# Patient Record
Sex: Female | Born: 1955 | Race: White | Hispanic: No | Marital: Married | Smoking: Former smoker
Health system: Southern US, Community
[De-identification: ages and names within clinical notes are randomized; demographics above are authoritative.]

## PROBLEM LIST (undated history)

## (undated) DIAGNOSIS — H903 Sensorineural hearing loss, bilateral: Secondary | ICD-10-CM

## (undated) DIAGNOSIS — R920 Mammographic microcalcification found on diagnostic imaging of breast: Secondary | ICD-10-CM

## (undated) DIAGNOSIS — E079 Disorder of thyroid, unspecified: Secondary | ICD-10-CM

## (undated) DIAGNOSIS — Z86010 Personal history of colonic polyps: Secondary | ICD-10-CM

## (undated) DIAGNOSIS — D099 Carcinoma in situ, unspecified: Secondary | ICD-10-CM

## (undated) HISTORY — DX: Sensorineural hearing loss, bilateral: H90.3

## (undated) HISTORY — DX: Carcinoma in situ, unspecified: D09.9

## (undated) HISTORY — DX: Disorder of thyroid, unspecified: E07.9

## (undated) HISTORY — DX: Mammographic microcalcification found on diagnostic imaging of breast: R92.0

## (undated) HISTORY — PX: HARDWARE REMOVAL: SHX979

## (undated) HISTORY — DX: Personal history of colonic polyps: Z86.010

---

## 2003-10-26 DIAGNOSIS — R920 Mammographic microcalcification found on diagnostic imaging of breast: Secondary | ICD-10-CM

## 2003-10-26 HISTORY — PX: BREAST BIOPSY: SHX20

## 2003-10-26 HISTORY — PX: BREAST SURGERY: SHX581

## 2003-10-26 HISTORY — DX: Mammographic microcalcification found on diagnostic imaging of breast: R92.0

## 2006-10-25 HISTORY — PX: BREAST CYST ASPIRATION: SHX578

## 2007-10-26 DIAGNOSIS — Z860100 Personal history of colon polyps, unspecified: Secondary | ICD-10-CM

## 2007-10-26 HISTORY — DX: Personal history of colon polyps, unspecified: Z86.0100

## 2007-10-26 HISTORY — DX: Personal history of colonic polyps: Z86.010

## 2009-12-23 HISTORY — PX: FRACTURE SURGERY: SHX138

## 2011-10-26 DIAGNOSIS — D099 Carcinoma in situ, unspecified: Secondary | ICD-10-CM

## 2011-10-26 HISTORY — DX: Carcinoma in situ, unspecified: D09.9

## 2012-07-19 ENCOUNTER — Encounter: Payer: Self-pay | Admitting: Family

## 2012-07-19 ENCOUNTER — Ambulatory Visit (INDEPENDENT_AMBULATORY_CARE_PROVIDER_SITE_OTHER): Payer: TRICARE For Life (TFL) | Admitting: Family

## 2012-07-19 VITALS — BP 102/80 | HR 64 | Temp 98.2°F | Resp 16 | Ht 63.5 in | Wt 152.0 lb

## 2012-07-19 DIAGNOSIS — E039 Hypothyroidism, unspecified: Secondary | ICD-10-CM

## 2012-07-19 NOTE — Progress Notes (Signed)
Subjective:    Patient ID: Alexandra Hoover, female    DOB: 1956-08-04, 56 y.o.   MRN: [id number]  HPI  [name] is a 56 yr old female who presents today to establish care.  She is originally from [place], but has lived all over the country and in [place] in the past 28 years asher husband was in the air [employer].  Most recently, she lived in [place].   Hypothyroid- Pt reports that she was diagnosed with hypothyroidism in 2005. For about a year she was treated with synthroid , but was then increased to .  She reports that she has remained stable at the dose. She denies lethargy or weight gain.      Review of Systems  Constitutional:       Has lost a few pounds.   HENT: Negative for congestion.        Mild hearing problems.  Declines referral to audiology  Eyes: Negative for visual disturbance.  Respiratory: Negative for cough and shortness of breath.   Cardiovascular: Negative for chest pain.       R inner ankle swelling from recent surgery  Gastrointestinal: Negative for nausea, vomiting and diarrhea.  Genitourinary: Negative for dysuria, urgency and frequency.       Lmp age 14  Musculoskeletal:       Occasional knee pain  Skin: Negative for rash.  Neurological: Negative for headaches.  Hematological: Does not bruise/bleed easily.  Psychiatric/Behavioral:       Denies depression/anxiety   Past Medical History  Diagnosis Date  . Thyroid disease     hypothyroid  . History of colon polyps 2009    History   Social History  . Marital Status: Married    Spouse Name: N/A    Number of Children: 5  . Years of Education: N/A   Occupational History  .     Social History Main Topics  . Smoking status: Former Games developer  . Smokeless tobacco: Never Used  . Alcohol Use: 1.0 - 1.5 oz/week    2-3 drink(s) per week  . Drug Use: Not on file  . Sexually Active: Not on file   Other Topics Concern  . Not on file   Social History Narrative   Regular exercise:  4-5 x  weeklyCaffeine use:  2 cups coffee daily4 living children- son in [place], daughter [place], daughter [place], son Alexandra Hoover ([place]1 child died of sidsWorks as a Acupuncturist (telecommutes)    Past Surgical History  Procedure Date  . Breast surgery 2005    Left breast biopsy--microcalcifications  . Fracture surgery 12/2009    fib/tib fracture  . Hardware removal 10/2010 and 05/2017    screws removed from ankle    Family History  Problem Relation Age of Onset  . Atrial fibrillation Mother   . Hypertension Mother   . Hypertension Father   . Heart disease Father   . Hypothyroidism Sister   . Hyperlipidemia Brother   . Diabetes Paternal Aunt   . Stroke Paternal Uncle   . Hypertension Sister     No Known Allergies  Current Outpatient Prescriptions on File Prior to Visit  Medication Sig Dispense Refill  . levothyroxine (SYNTHROID, LEVOTHROID) 50 MCG tablet Take 50 mcg by mouth daily.        BP 102/80  Pulse 64  Temp 98.2 F (36.8 C) (Oral)  Resp 16  Ht 5' 3.5" (1.613 m)  Wt 152 lb (68.947 kg)  BMI 26.50 kg/m2  SpO2 99%  LMP 07/19/2009       Objective:   Physical Exam  Constitutional: She is oriented to person, place, and time. She appears well-developed and well-nourished. No distress.  HENT:  Head: Normocephalic and atraumatic.  Eyes: No scleral icterus.  Cardiovascular: Normal rate and regular rhythm.   No murmur heard. Pulmonary/Chest: Effort normal and breath sounds normal. No respiratory distress. She has no wheezes. She has no rales. She exhibits no tenderness.  Musculoskeletal: She exhibits no edema.  Lymphadenopathy:    She has no cervical adenopathy.  Neurological: She is alert and oriented to person, place, and time.  Skin: Skin is warm and dry.  Psychiatric: She has a normal mood and affect. Her behavior is normal. Judgment and thought content normal.          Assessment & Plan:

## 2012-07-19 NOTE — Patient Instructions (Addendum)
Please schedule fasting physical at your convenience.  Welcome to [facility]! 

## 2012-07-19 NOTE — Assessment & Plan Note (Signed)
Clinically stable. Obtain TSH, continue synthroid.  

## 2012-07-20 ENCOUNTER — Telehealth: Payer: Self-pay | Admitting: Family

## 2012-07-20 LAB — TSH: TSH: 2.159 u[IU]/mL (ref 0.350–4.500)

## 2012-07-20 MED ORDER — LEVOTHYROXINE SODIUM 50 MCG PO TABS: 50.0000 ug | ORAL_TABLET | Freq: Every day | ORAL | Status: DC

## 2012-07-20 NOTE — Telephone Encounter (Signed)
tsh normal, refill sent to pharmacy.

## 2012-07-20 NOTE — Telephone Encounter (Signed)
Notified pt. 

## 2012-09-15 ENCOUNTER — Encounter: Payer: Self-pay | Admitting: Family

## 2012-09-15 ENCOUNTER — Ambulatory Visit (INDEPENDENT_AMBULATORY_CARE_PROVIDER_SITE_OTHER): Payer: TRICARE For Life (TFL) | Admitting: Family

## 2012-09-15 VITALS — BP 112/78 | HR 63 | Temp 98.1°F | Resp 16 | Wt 152.1 lb

## 2012-09-15 DIAGNOSIS — Z0000 Encounter for general adult medical examination without abnormal findings: Secondary | ICD-10-CM

## 2012-09-15 DIAGNOSIS — L989 Disorder of the skin and subcutaneous tissue, unspecified: Secondary | ICD-10-CM

## 2012-09-15 DIAGNOSIS — D099 Carcinoma in situ, unspecified: Secondary | ICD-10-CM | POA: Insufficient documentation

## 2012-09-15 DIAGNOSIS — Z1231 Encounter for screening mammogram for malignant neoplasm of breast: Secondary | ICD-10-CM

## 2012-09-15 NOTE — Patient Instructions (Addendum)
Please schedule mammogram on the first floor.  We will call you with the results of your biopsy.  Keep area dry x 24 hours.  Call if you develop redness surrounding the site.

## 2012-09-15 NOTE — Assessment & Plan Note (Signed)
Procedure including risks/benefits explained to patient.  Questions were answered. After informed consent was obtained site was cleansed with betadine and then alcohol. 1% Lidocaine with epinephrine was injected under lesion and then shave biopsy was performed. Area was cauterized to obtain hemostasis.  Pt tolerated procedure well.  Specimen sent for pathology review.  Pt instructed to keep the area dry for 24 hours and to contact us if he develops redness, drainage or swelling at the site.

## 2012-09-15 NOTE — Addendum Note (Signed)
Addended by: Mervin Kung A on: 09/15/2012 03:04 PM   Modules accepted: Orders

## 2012-09-15 NOTE — Progress Notes (Signed)
  Subjective:    Patient ID: Alexandra Hoover, female    DOB: 1956/07/15, 56 y.o.   MRN: [id number]  HPI  Alexandra Hoover is a 56 yr old female who presents today with chief complaint of skin lesion on her left arm.  Lesion has been present since August and pt reports that it has grown larger.  She would like to have it removed.     Review of Systems    Objective:   Physical Exam  Constitutional: She appears well-developed and well-nourished. No distress.  Skin:       Pink, raised lesion noted on left forearm, rough to the touch.            Assessment & Plan:

## 2012-09-18 ENCOUNTER — Telehealth: Payer: Self-pay | Admitting: Family

## 2012-09-18 DIAGNOSIS — C4490 Unspecified malignant neoplasm of skin, unspecified: Secondary | ICD-10-CM

## 2012-09-18 NOTE — Telephone Encounter (Addendum)
Left message for pt to return my call.

## 2012-09-19 NOTE — Telephone Encounter (Signed)
Spoke to pt. Reviewed pathology findings and suggested referral to dermatology.  Pt is agreeable.

## 2013-01-12 ENCOUNTER — Ambulatory Visit (INDEPENDENT_AMBULATORY_CARE_PROVIDER_SITE_OTHER): Payer: TRICARE For Life (TFL) | Admitting: Family

## 2013-01-12 ENCOUNTER — Encounter: Payer: Self-pay | Admitting: Family

## 2013-01-12 VITALS — BP 100/78 | HR 80 | Temp 98.5°F | Resp 18 | Ht 63.5 in | Wt 154.0 lb

## 2013-01-12 DIAGNOSIS — D099 Carcinoma in situ, unspecified: Secondary | ICD-10-CM

## 2013-01-12 DIAGNOSIS — E039 Hypothyroidism, unspecified: Secondary | ICD-10-CM

## 2013-01-12 NOTE — Assessment & Plan Note (Signed)
Management per derm. Pt instructed to keep follow up visit.

## 2013-01-12 NOTE — Patient Instructions (Addendum)
Please complete lab work prior to leaving.  Follow up in 6 months.  

## 2013-01-12 NOTE — Progress Notes (Signed)
Subjective:    Patient ID: Alexandra Hoover, female    DOB: 05/12/1956, 57 y.o.   MRN: [id number]  HPI  [name] is a 57 yr old female here today for follow up.  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 Noted on skin biopsy November 2013. She was referred to derm and reports that they performed a wider excision.   She has follow up next month.  Hypothyroid- on synthroid.  Weight has been stable.   Review of Systems See hpi  Past Medical History  Diagnosis Date  . Thyroid disease     hypothyroid  . History of colon polyps 2009  . 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 Marital Status: Married    Spouse Name: N/A    Number of Children: 5  . Years of Education: N/A   Occupational History  .     Social History Main Topics  . Smoking status: Former Games developer  . Smokeless tobacco: Never Used  . Alcohol Use: 1 - 1.5 oz/week    2-3 drink(s) per week  . Drug Use: Not on file  . Sexually Active: Not on file   Other Topics Concern  . Not on file   Social History Narrative   Regular exercise:  4-5 x weekly   Caffeine use:  2 cups coffee daily   4 living children- son in [place], daughter [place], daughter [place], son Iline Oven ([place])   1 child died of sids   Works as a Acupuncturist (telecommutes)                Past Surgical History  Procedure Laterality Date  . Breast surgery  2005    Left breast biopsy--microcalcifications  . Fracture surgery  12/2009    fib/tib fracture  . Hardware removal  10/2010 and 05/2017    screws removed from ankle    Family History  Problem Relation Age of Onset  . Atrial fibrillation Mother   . Hypertension Mother   . Hypertension Father   . Heart disease Father   . Hypothyroidism Sister   . Hyperlipidemia Brother   . Diabetes Paternal Aunt   . Stroke Paternal Uncle   . Hypertension Sister     No Known Allergies  Current Outpatient Prescriptions on File Prior  to Visit  Medication Sig Dispense Refill  . levothyroxine (SYNTHROID, LEVOTHROID) 50 MCG tablet Take 1 tablet (50 mcg total) by mouth daily.  30 tablet  5   No current facility-administered medications on file prior to visit.    BP 100/78  Pulse 80  Temp(Src) 98.5 F (36.9 C) (Oral)  Resp 18  Ht 5' 3.5" (1.613 m)  Wt 154 lb (69.854 kg)  BMI 26.85 kg/m2  SpO2 98%  LMP 07/19/2009       Objective:   Physical Exam  Constitutional: She is oriented to person, place, and time. She appears well-developed and well-nourished. No distress.  HENT:  Head: Normocephalic and atraumatic.  Cardiovascular: Normal rate.   No murmur heard. Pulmonary/Chest: Effort normal and breath sounds normal. No respiratory distress. She has no wheezes. She has no rales. She exhibits no tenderness.  Musculoskeletal: She exhibits no edema.  Lymphadenopathy:    She has no cervical adenopathy.  Neurological: She is alert and oriented to person, place, and time.  Skin: Skin is warm and dry.  Psychiatric: She has a normal mood and affect. Her behavior  is normal. Judgment and thought content normal.          Assessment & Plan:

## 2013-01-12 NOTE — Assessment & Plan Note (Signed)
Clinically stable. continue synthroid, obtain tsh.

## 2013-01-15 ENCOUNTER — Telehealth: Payer: Self-pay | Admitting: Family

## 2013-01-15 MED ORDER — LEVOTHYROXINE SODIUM 50 MCG PO TABS: 50.0000 ug | ORAL_TABLET | Freq: Every day | ORAL | Status: DC

## 2013-01-15 NOTE — Telephone Encounter (Signed)
Notified pt. 

## 2013-01-15 NOTE — Telephone Encounter (Signed)
Could you pls let pt know that TSH is normal and  send refill on synthroid #30 with 5 refills to [facility] [address]?  I am having trouble finding the pharmacy in system. Thanks.

## 2013-01-30 ENCOUNTER — Ambulatory Visit
Admission: RE | Admit: 2013-01-30 | Discharge: 2013-01-30 | Disposition: A | Discharge status: Home / Self Care | Payer: TRICARE For Life (TFL) | Source: Ambulatory Visit | Attending: Family | Admitting: Family

## 2013-01-30 ENCOUNTER — Other Ambulatory Visit: Payer: Self-pay | Admitting: Family

## 2013-01-30 ENCOUNTER — Ambulatory Visit: Admission: RE | Admit: 2013-01-30 | Payer: TRICARE For Life (TFL) | Source: Ambulatory Visit

## 2013-01-30 DIAGNOSIS — Z1231 Encounter for screening mammogram for malignant neoplasm of breast: Secondary | ICD-10-CM

## 2013-03-28 ENCOUNTER — Ambulatory Visit (INDEPENDENT_AMBULATORY_CARE_PROVIDER_SITE_OTHER): Payer: TRICARE For Life (TFL) | Admitting: Family

## 2013-03-28 ENCOUNTER — Encounter: Payer: Self-pay | Admitting: Family

## 2013-03-28 VITALS — BP 118/70 | HR 60 | Temp 97.9°F | Ht 63.5 in | Wt 142.1 lb

## 2013-03-28 DIAGNOSIS — N390 Urinary tract infection, site not specified: Secondary | ICD-10-CM

## 2013-03-28 LAB — POCT URINALYSIS DIPSTICK
Glucose, UA: NEGATIVE
Nitrite, UA: NEGATIVE
Protein, UA: NEGATIVE
Spec Grav, UA: <=1.005
Urobilinogen, UA: 0.2
pH, UA: 8

## 2013-03-28 MED ORDER — CIPROFLOXACIN HCL 500 MG PO TABS: 500.0000 mg | ORAL_TABLET | Freq: Two times a day (BID) | ORAL | Status: DC

## 2013-03-28 NOTE — Progress Notes (Signed)
  Subjective:    Patient ID: Alexandra Hoover, female    DOB: Nov 10, 1955, 57 y.o.   MRN: [id number]  HPI  [name] is a 57 yr old female who presents today to discuss dysuria. Symptoms started Saturday AM then resolved.  Today she reports dysuria and "pink lemonade colored lemonade."  In the past she has been on prophylactic macrodantin for recurrent UTI, but weaned herself off and has not had UTI in 3-4 yrs.  Denies fever, low back pain or kidney stones.     Review of Systems See HPI  Past Medical History  Diagnosis Date  . Thyroid disease     hypothyroid  . History of colon polyps 2009  . 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 Marital Status: Married    Spouse Name: N/A    Number of Children: 5  . Years of Education: N/A   Occupational History  .     Social History Main Topics  . Smoking status: Former Games developer  . Smokeless tobacco: Never Used  . Alcohol Use: 1 - 1.5 oz/week    2-3 drink(s) per week  . Drug Use: Not on file  . Sexually Active: Not on file   Other Topics Concern  . Not on file   Social History Narrative   Regular exercise:  4-5 x weekly   Caffeine use:  2 cups coffee daily   4 living children- son in [place], daughter [place], daughter [place], son [place]line [place] ([place]   1 child died of sids   Works as a Acupuncturist (telecommutes)                Past Surgical History  Procedure Laterality Date  . Breast surgery  2005    Left breast biopsy--microcalcifications  . Fracture surgery  12/2009    fib/tib fracture  . Hardware removal  10/2010 and 05/2017    screws removed from ankle    Family History  Problem Relation Age of Onset  . Atrial fibrillation Mother   . Hypertension Mother   . Hypertension Father   . Heart disease Father   . Hypothyroidism Sister   . Hyperlipidemia Brother   . Diabetes Paternal Aunt   . Stroke Paternal Uncle   . Hypertension Sister     No Known  Allergies  Current Outpatient Prescriptions on File Prior to Visit  Medication Sig Dispense Refill  . levothyroxine (SYNTHROID, LEVOTHROID) 50 MCG tablet Take 1 tablet (50 mcg total) by mouth daily.  30 tablet  5   No current facility-administered medications on file prior to visit.    BP 118/70  Pulse 60  Temp(Src) 97.9 F (36.6 C) (Oral)  Ht 5' 3.5" (1.613 m)  Wt 142 lb 1.3 oz (64.447 kg)  BMI 24.77 kg/m2  SpO2 98%  LMP 07/19/2009       Objective:   Physical Exam  Gen: awake, alert, NAD CV: s1/s2, RRR Resp: BS CTA bilaterally without W/R/R Abd: soft, NT/ND, + BP GU: Neg CVAT bilaterally Psych: A and O x 3, calm and pleasant.       Assessment & Plan:

## 2013-03-28 NOTE — Patient Instructions (Addendum)
Please call if symptoms worsen or if not improved in 2-3 days. Please return to the lab in 1 month for follow up Urinalysis.   Urinary Tract Infection Urinary tract infections (UTIs) can develop anywhere along your urinary tract. Your urinary tract is your body's drainage system for removing wastes and extra water. Your urinary tract includes two kidneys, two ureters, a bladder, and a urethra. Your kidneys are a pair of bean-shaped organs. Each kidney is about the size of your fist. They are located below your ribs, one on each side of your spine. CAUSES Infections are caused by microbes, which are microscopic organisms, including fungi, viruses, and bacteria. These organisms are so small that they can only be seen through a microscope. Bacteria are the microbes that most commonly cause UTIs. SYMPTOMS  Symptoms of UTIs may vary by age and gender of the patient and by the location of the infection. Symptoms in young women typically include a frequent and intense urge to urinate and a painful, burning feeling in the bladder or urethra during urination. Older women and men are more likely to be tired, shaky, and weak and have muscle aches and abdominal pain. A fever may mean the infection is in your kidneys. Other symptoms of a kidney infection include pain in your back or sides below the ribs, nausea, and vomiting. DIAGNOSIS To diagnose a UTI, your caregiver will ask you about your symptoms. Your caregiver also will ask to provide a urine sample. The urine sample will be tested for bacteria and white blood cells. White blood cells are made by your body to help fight infection. TREATMENT  Typically, UTIs can be treated with medication. Because most UTIs are caused by a bacterial infection, they usually can be treated with the use of antibiotics. The choice of antibiotic and length of treatment depend on your symptoms and the type of bacteria causing your infection. HOME CARE INSTRUCTIONS  If you were  prescribed antibiotics, take them exactly as your caregiver instructs you. Finish the medication even if you feel better after you have only taken some of the medication.  Drink enough water and fluids to keep your urine clear or pale yellow.  Avoid caffeine, tea, and carbonated beverages. They tend to irritate your bladder.  Empty your bladder often. Avoid holding urine for long periods of time.  Empty your bladder before and after sexual intercourse.  After a bowel movement, women should cleanse from front to back. Use each tissue only once. SEEK MEDICAL CARE IF:   You have back pain.  You develop a fever.  Your symptoms do not begin to resolve within 3 days. SEEK IMMEDIATE MEDICAL CARE IF:   You have severe back pain or lower abdominal pain.  You develop chills.  You have nausea or vomiting.  You have continued burning or discomfort with urination. MAKE SURE YOU:   Understand these instructions.  Will watch your condition.  Will get help right away if you are not doing well or get worse. Document Released: 07/21/2005 Document Revised: 04/11/2012 Document Reviewed: 11/19/2011 [facility] Patient Information [address], [place].

## 2013-03-28 NOTE — Assessment & Plan Note (Signed)
Urine dip notes small leuks, moderate blood. Plan rx with cipro x 7 days given hematuria.  No fever or low back pain to suggest stone/pyelo.  Pt instructed to call if symptoms worsen or if not improved in 2-3 days.

## 2013-03-30 ENCOUNTER — Telehealth: Payer: Self-pay | Admitting: Family

## 2013-03-30 DIAGNOSIS — R3129 Other microscopic hematuria: Secondary | ICD-10-CM

## 2013-03-30 NOTE — Telephone Encounter (Signed)
Pls call pt and let her know that urine culture is neg.  She should stop abx after 3 days.  Follow up in 1 month for repeat urinalysis (dx hematuria). Call sooner if she sees blood in urine.

## 2013-03-30 NOTE — Telephone Encounter (Signed)
Notified pt. She thought she was told there would be a standing order to repeat urine. Does she need to see you in 1 month or urine only?

## 2013-03-31 NOTE — Telephone Encounter (Signed)
Only needs urine no visit in 1 month.

## 2013-04-02 NOTE — Telephone Encounter (Signed)
Notified pt. Future order entered.

## 2013-06-11 ENCOUNTER — Telehealth: Payer: Self-pay | Admitting: Family

## 2013-06-11 NOTE — Telephone Encounter (Signed)
Patient Information:  Caller Name: Princes  Phone: [phone]  Patient: Alexandra Hoover, Alexandra Hoover  Gender: Female  DOB: 02/06/1956  Age: 57 Years  PCP: Sandford Craze (Adults only)  Pregnant: No  Office Follow Up:  Does the office need to follow up with this patient?: No  Instructions For The Office: N/A  RN Note:  Noted facial swelling and mild drooping of right upper lip and cheek.  States is mildly pink but not tender or warm.  Able to move all extremities.  Did not eat anything unusual.  Onset 06/11/13 1400.  Denies weakness of face, inability to smile symmetrically, or weakness of extremities.  Emergent symptoms denied per hives and facial swelling protocols; advised appt within 24 hours per nursing judgment.  Appt scheduled with Ms. O'Sulllivan 06/12/13 0815.  Care measures advised, with callback parameters given.  krs/can  Symptoms  Reason For Call & Symptoms: lip swelling, swelling on cheek  Reviewed Health History In EMR: Yes  Reviewed Medications In EMR: Yes  Reviewed Allergies In EMR: Yes  Reviewed Surgeries / Procedures: Yes  Date of Onset of Symptoms: 06/11/2013 OB / GYN:  LMP: Unknown  Guideline(s) Used:  Face Swelling  Hives  Disposition Per Guideline:   See Within 3 Days in Office  Reason For Disposition Reached:   Mild facial swelling from food reaction and diagnosis never confirmed by a physician  Advice Given:  Reassurance:  Mild face swelling or puffiness can be caused by a mild allergic reaction. For example people can have a reaction to pollen, something they have eaten, a chemical, or some other allergic substance.  Here is some care advice that may help.  Apply Cold to the Area:  Apply a cool, wet washcloth to the face for 20 minutes.  Antihistamine Medication for Facial Swelling:  Sometimes antihistamine medications are helpful in reducing swelling from a food or allergic reaction. You can take diphenhydramine (e.g., Benadryl). The adult dosage 25-50 mg by  mouth every 6 hours on an as needed basis.  Call Back If  Swelling persists over 3 days  Swelling becomes red or painful to the touch  You become worse.  Patient Will Follow Care Advice:  YES  Appointment Scheduled:  06/12/2013 08:15:00 Appointment Scheduled Provider:  Sandford Craze (Adults only)

## 2013-06-11 NOTE — Telephone Encounter (Signed)
Spoke with pt. She confirms that symptoms have not worsened. Took Benadryl around 4pm and thinks there may be slight improvement of facial swelling. Denies sob, speech difficulty or extremity weakness. Advised pt if symptoms worsen to proceed to the ER for evaluation and she voices understanding.

## 2013-06-12 ENCOUNTER — Ambulatory Visit: Payer: Self-pay | Admitting: Family

## 2013-06-12 ENCOUNTER — Telehealth: Payer: Self-pay | Admitting: Family

## 2013-06-12 NOTE — Telephone Encounter (Signed)
Left message for pt to return my call to let me know how she is feeling.

## 2013-07-10 ENCOUNTER — Ambulatory Visit (INDEPENDENT_AMBULATORY_CARE_PROVIDER_SITE_OTHER): Admitting: Family

## 2013-07-10 ENCOUNTER — Encounter: Payer: Self-pay | Admitting: Family

## 2013-07-10 VITALS — BP 110/60 | HR 70 | Temp 98.4°F | Resp 16 | Ht 63.5 in | Wt 136.1 lb

## 2013-07-10 DIAGNOSIS — R3129 Other microscopic hematuria: Secondary | ICD-10-CM

## 2013-07-10 DIAGNOSIS — E039 Hypothyroidism, unspecified: Secondary | ICD-10-CM

## 2013-07-10 NOTE — Progress Notes (Signed)
Subjective:    Patient ID: Alexandra Hoover, female    DOB: [birth year], 57 y.o.   MRN: [id number]  HPI  [name] is a 57 yr old female with hx of hypothyroidism who presents today for follow up.  She is maintained on levothyroxine .  She has lost 18 pounds since march. She has cut back on bread.  Reports feeling well on synthroid dose.    Review of Systems See HPI  Past Medical History  Diagnosis Date  . Thyroid disease     hypothyroid  . History of colon polyps 2009  . 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 Marital Status: Married    Spouse Name: N/A    Number of Children: 5  . Years of Education: N/A   Occupational History  .     Social History Main Topics  . Smoking status: Former Games developer  . Smokeless tobacco: Never Used  . Alcohol Use: 1 - 1.5 oz/week    2-3 drink(s) per week  . Drug Use: Not on file  . Sexual Activity: Not on file   Other Topics Concern  . Not on file   Social History Narrative   Regular exercise:  4-5 x weekly   Caffeine use:  2 cups coffee daily   4 living children- son in [place], daughter [place], daughter [place], son Iline O[place] ([place]   1 child died of sids   Works as a Acupuncturist (telecommutes)                Past Surgical History  Procedure Laterality Date  . Breast surgery  2005    Left breast biopsy--microcalcifications  . Fracture surgery  12/2009    fib/tib fracture  . Hardware removal  10/2010 and 05/2017    screws removed from ankle    Family History  Problem Relation Age of Onset  . Atrial fibrillation Mother   . Hypertension Mother   . Hypertension Father   . Heart disease Father   . Hypothyroidism Sister   . Hyperlipidemia Brother   . Diabetes Paternal Aunt   . Stroke Paternal Uncle   . Hypertension Sister     No Known Allergies  Current Outpatient Prescriptions on File Prior to Visit  Medication Sig Dispense Refill  . levothyroxine  (SYNTHROID, LEVOTHROID) 50 MCG tablet Take 1 tablet (50 mcg total) by mouth daily.  30 tablet  5   No current facility-administered medications on file prior to visit.    BP 110/60  Pulse 70  Temp(Src) 98.4 F (36.9 C) (Oral)  Resp 16  Ht 5' 3.5" (1.613 m)  Wt 136 lb 1.9 oz (61.744 kg)  BMI 23.73 kg/m2  SpO2 97%  LMP 07/19/2009       Objective:   Physical Exam  Constitutional: She is oriented to person, place, and time. She appears well-developed and well-nourished. No distress.  HENT:  Head: Normocephalic and atraumatic.  Cardiovascular: Normal rate and regular rhythm.   No murmur heard. Pulmonary/Chest: Effort normal and breath sounds normal. No respiratory distress. She has no wheezes. She has no rales. She exhibits no tenderness.  Lymphadenopathy:    She has no cervical adenopathy.  Neurological: She is alert and oriented to person, place, and time.  Psychiatric: She has a normal mood and affect. Her behavior is normal. Judgment and thought content normal.          Assessment &  Plan:

## 2013-07-10 NOTE — Patient Instructions (Signed)
Please complete lab work prior to leaving.   

## 2013-07-11 ENCOUNTER — Encounter: Payer: Self-pay | Admitting: Family

## 2013-07-11 LAB — URINALYSIS, ROUTINE W REFLEX MICROSCOPIC
Bilirubin Urine: NEGATIVE
Glucose, UA: NEGATIVE mg/dL
Ketones, ur: NEGATIVE mg/dL
Specific Gravity, Urine: 1.01 (ref 1.005–1.030)
Urobilinogen, UA: 0.2 mg/dL (ref 0.0–1.0)
pH: 6 (ref 5.0–8.0)

## 2013-07-11 MED ORDER — LEVOTHYROXINE SODIUM 50 MCG PO TABS: 50.0000 ug | ORAL_TABLET | Freq: Every day | ORAL | Status: DC

## 2013-07-11 NOTE — Assessment & Plan Note (Signed)
Clinically stable. Obtain tsh, continue synthroid. 

## 2013-07-11 NOTE — Addendum Note (Signed)
Addended by: Sandford Craze on: 07/11/2013 04:03 PM   Modules accepted: Orders

## 2013-12-27 ENCOUNTER — Other Ambulatory Visit: Payer: Self-pay

## 2013-12-27 DIAGNOSIS — Z803 Family history of malignant neoplasm of breast: Secondary | ICD-10-CM

## 2013-12-27 DIAGNOSIS — Z1231 Encounter for screening mammogram for malignant neoplasm of breast: Secondary | ICD-10-CM

## 2014-01-02 ENCOUNTER — Encounter: Payer: Self-pay | Admitting: Family

## 2014-01-02 ENCOUNTER — Ambulatory Visit (INDEPENDENT_AMBULATORY_CARE_PROVIDER_SITE_OTHER): Admitting: Family

## 2014-01-02 VITALS — BP 108/70 | HR 75 | Temp 98.9°F | Resp 16 | Ht 63.5 in | Wt 148.1 lb

## 2014-01-02 DIAGNOSIS — E039 Hypothyroidism, unspecified: Secondary | ICD-10-CM

## 2014-01-02 LAB — TSH: TSH: 2.378 u[IU]/mL (ref 0.350–4.500)

## 2014-01-02 NOTE — Progress Notes (Signed)
Pre visit review using our clinic review tool, if applicable. No additional management support is needed unless otherwise documented below in the visit note. 

## 2014-01-02 NOTE — Progress Notes (Signed)
Subjective:    Patient ID: Alexandra Hoover, female    DOB: 11-Nov-1955, 58 y.o.   MRN: [id number]  HPI  Alexandra Hoover is a 58 yr old female who pesents today for follow up of her hypothyroidism. She was last seen in September. Her current synthroid dose is 50 mcg once daily. Denies heat intolerance.   Has gained some weight back since last visit, but plans to try to get back down now that the weather is improved.  Wt Readings from Last 3 Encounters:  01/02/14 148 lb 1.9 oz (67.187 kg)  07/10/13 136 lb 1.9 oz (61.744 kg)  03/28/13 142 lb 1.3 oz (64.447 kg)   Body mass index is 25.82 kg/(m^2).  Her sister was recently diagnosed with breast cancer. She has upcoming mammogram. Stressed about her sister's treatment.   Review of Systems See HPI  Past Medical History  Diagnosis Date  . Thyroid disease     hypothyroid  . History of colon polyps 2009  . 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 Marital Status: Married    Spouse Name: N/A    Number of Children: 65  . Years of Education: N/A   Occupational History  .     Social History Main Topics  . Smoking status: Former Research scientist (life sciences)  . Smokeless tobacco: Never Used  . Alcohol Use: 1 - 1.5 oz/week    2-3 drink(s) per week  . Drug Use: Not on file  . Sexual Activity: Not on file   Other Topics Concern  . 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 Breast surgery  2005    Left breast biopsy--microcalcifications  . Fracture surgery  12/2009    fib/tib fracture  . Hardware removal  10/2010 and 05/2017    screws removed from ankle    Family History  Problem Relation Age of Onset    . Atrial fibrillation Mother   . Hypertension Mother   . Hypertension Father   . Heart disease Father   . Hypothyroidism Sister   . Hyperlipidemia Brother   . Diabetes Paternal Aunt   . Stroke Paternal Uncle   . Hypertension Sister     No Known Allergies  Current Outpatient Prescriptions on File Prior to Visit  Medication Sig Dispense Refill  . levothyroxine (SYNTHROID, LEVOTHROID) 50 MCG tablet Take 1 tablet (50 mcg total) by mouth daily.  30 tablet  5   No current facility-administered medications on file prior to visit.    LMP 07/19/2009       Objective:   Physical Exam  Constitutional: She is oriented to person, place, and time. She appears well-developed and well-nourished. No distress.  HENT:  Head: Normocephalic and atraumatic.  Neck: No thyromegaly present.  Cardiovascular: Normal rate and regular rhythm.   No murmur heard. Pulmonary/Chest: Effort normal and breath sounds normal. No respiratory distress. She has no wheezes. She has no rales. She exhibits no tenderness.  Neurological: She is alert and oriented to person, place, and time.  Psychiatric: She has a normal mood and affect. Her behavior is normal. Judgment and thought content normal.          Assessment & Plan:

## 2014-01-02 NOTE — Patient Instructions (Signed)
Please complete lab work prior to leaving. Follow up in 6 month for annual physical- after 07/10/14.

## 2014-01-02 NOTE — Assessment & Plan Note (Signed)
Clinically stable, obtain TSH, continue current dose of synthroid.

## 2014-01-03 ENCOUNTER — Encounter: Payer: Self-pay | Admitting: Family

## 2014-01-03 MED ORDER — LEVOTHYROXINE SODIUM 50 MCG PO TABS: 50.0000 ug | ORAL_TABLET | Freq: Every day | ORAL | Status: DC

## 2014-01-31 ENCOUNTER — Ambulatory Visit
Admission: RE | Admit: 2014-01-31 | Discharge: 2014-01-31 | Disposition: A | Discharge status: Home / Self Care | Source: Ambulatory Visit

## 2014-01-31 DIAGNOSIS — Z1231 Encounter for screening mammogram for malignant neoplasm of breast: Secondary | ICD-10-CM

## 2014-01-31 DIAGNOSIS — Z803 Family history of malignant neoplasm of breast: Secondary | ICD-10-CM

## 2014-02-01 ENCOUNTER — Encounter: Payer: Self-pay | Admitting: *Deleted

## 2014-02-01 ENCOUNTER — Other Ambulatory Visit: Payer: Self-pay | Admitting: Family

## 2014-02-01 DIAGNOSIS — R928 Other abnormal and inconclusive findings on diagnostic imaging of breast: Secondary | ICD-10-CM

## 2014-02-01 NOTE — Telephone Encounter (Signed)
Opened in error

## 2014-02-04 ENCOUNTER — Telehealth: Payer: Self-pay | Admitting: *Deleted

## 2014-02-04 ENCOUNTER — Encounter: Payer: Self-pay | Admitting: *Deleted

## 2014-02-04 NOTE — Telephone Encounter (Signed)
Notified pt. She will be having additional imaging next week. Reports that she was told she has microcalcifications in her left breast about 10 years ago. Had stereotactic biopsy and was told it was benign.

## 2014-02-04 NOTE — Telephone Encounter (Signed)
Message copied by Ronny Flurry on Mon Feb 04, 2014  1:43 PM ------      Message from: O'SULLIVAN, MELISSA      Created: Fri Feb 01, 2014  7:09 AM       Please lt pt know that the radiologist noted some calcifications on mammogram which they would like to take a closer look at.  Let me know if she is not contacted about additional imaging within 1 week. ------

## 2014-02-12 ENCOUNTER — Encounter (INDEPENDENT_AMBULATORY_CARE_PROVIDER_SITE_OTHER): Payer: Self-pay

## 2014-02-12 ENCOUNTER — Ambulatory Visit
Admission: RE | Admit: 2014-02-12 | Discharge: 2014-02-12 | Disposition: A | Discharge status: Home / Self Care | Source: Ambulatory Visit | Attending: Family | Admitting: Family

## 2014-02-12 DIAGNOSIS — R928 Other abnormal and inconclusive findings on diagnostic imaging of breast: Secondary | ICD-10-CM

## 2014-07-03 ENCOUNTER — Encounter: Admitting: Family

## 2014-07-12 ENCOUNTER — Other Ambulatory Visit: Payer: Self-pay

## 2014-07-12 ENCOUNTER — Other Ambulatory Visit: Payer: Self-pay | Admitting: Family

## 2014-07-12 DIAGNOSIS — R921 Mammographic calcification found on diagnostic imaging of breast: Secondary | ICD-10-CM

## 2014-07-15 ENCOUNTER — Ambulatory Visit (INDEPENDENT_AMBULATORY_CARE_PROVIDER_SITE_OTHER): Admitting: Family

## 2014-07-15 ENCOUNTER — Encounter: Payer: Self-pay | Admitting: Family

## 2014-07-15 ENCOUNTER — Other Ambulatory Visit (HOSPITAL_COMMUNITY)
Admission: RE | Admit: 2014-07-15 | Discharge: 2014-07-15 | Disposition: A | Discharge status: Home / Self Care | Source: Ambulatory Visit | Attending: Family | Admitting: Family

## 2014-07-15 VITALS — BP 115/78 | HR 67 | Temp 98.7°F | Resp 16 | Ht 63.5 in | Wt 141.5 lb

## 2014-07-15 DIAGNOSIS — Z0000 Encounter for general adult medical examination without abnormal findings: Secondary | ICD-10-CM

## 2014-07-15 DIAGNOSIS — H93239 Hyperacusis, unspecified ear: Secondary | ICD-10-CM

## 2014-07-15 DIAGNOSIS — Z136 Encounter for screening for cardiovascular disorders: Secondary | ICD-10-CM | POA: Diagnosis not present

## 2014-07-15 DIAGNOSIS — Z01419 Encounter for gynecological examination (general) (routine) without abnormal findings: Secondary | ICD-10-CM | POA: Diagnosis present

## 2014-07-15 DIAGNOSIS — H9190 Unspecified hearing loss, unspecified ear: Secondary | ICD-10-CM | POA: Insufficient documentation

## 2014-07-15 DIAGNOSIS — Z23 Encounter for immunization: Secondary | ICD-10-CM

## 2014-07-15 LAB — CBC WITH DIFFERENTIAL/PLATELET
BASOS ABS: 0 10*3/uL (ref 0.0–0.1)
Basophils Relative: 0.4 % (ref 0.0–3.0)
EOS ABS: 0.1 10*3/uL (ref 0.0–0.7)
Eosinophils Relative: 1.4 % (ref 0.0–5.0)
HCT: 42 % (ref 36.0–46.0)
Hemoglobin: 14.1 g/dL (ref 12.0–15.0)
LYMPHS PCT: 28.3 % (ref 12.0–46.0)
Lymphs Abs: 1.5 10*3/uL (ref 0.7–4.0)
MCHC: 33.7 g/dL (ref 30.0–36.0)
MCV: 90 fl (ref 78.0–100.0)
MONO ABS: 0.4 10*3/uL (ref 0.1–1.0)
Monocytes Relative: 7.6 % (ref 3.0–12.0)
NEUTROS PCT: 62.3 % (ref 43.0–77.0)
Neutro Abs: 3.3 10*3/uL (ref 1.4–7.7)
PLATELETS: 204 10*3/uL (ref 150.0–400.0)
RBC: 4.67 Mil/uL (ref 3.87–5.11)
RDW: 12.9 % (ref 11.5–15.5)
WBC: 5.3 10*3/uL (ref 4.0–10.5)

## 2014-07-15 LAB — BASIC METABOLIC PANEL
BUN: 15 mg/dL (ref 6–23)
CHLORIDE: 107 meq/L (ref 96–112)
CO2: 27 meq/L (ref 19–32)
Calcium: 9.4 mg/dL (ref 8.4–10.5)
Creatinine, Ser: 0.9 mg/dL (ref 0.4–1.2)
GFR: 70.64 mL/min (ref >60.00)
Glucose, Bld: 85 mg/dL (ref 70–99)
Potassium: 4.5 mEq/L (ref 3.5–5.1)
Sodium: 144 mEq/L (ref 135–145)

## 2014-07-15 LAB — HEPATIC FUNCTION PANEL
ALBUMIN: 4.3 g/dL (ref 3.5–5.2)
ALK PHOS: 31 U/L — AB (ref 39–117)
ALT: 15 U/L (ref 0–35)
AST: 22 U/L (ref 0–37)
Bilirubin, Direct: 0 mg/dL (ref 0.0–0.3)
TOTAL PROTEIN: 7.4 g/dL (ref 6.0–8.3)
Total Bilirubin: 0.6 mg/dL (ref 0.2–1.2)

## 2014-07-15 LAB — URINALYSIS, ROUTINE W REFLEX MICROSCOPIC
Bilirubin Urine: NEGATIVE
HGB URINE DIPSTICK: NEGATIVE
KETONES UR: NEGATIVE
Leukocytes, UA: NEGATIVE
Nitrite: NEGATIVE
Specific Gravity, Urine: 1.015 (ref 1.000–1.030)
Total Protein, Urine: NEGATIVE
UROBILINOGEN UA: 0.2 (ref 0.0–1.0)
Urine Glucose: NEGATIVE
WBC UA: NONE SEEN — AB (ref 0–?)
pH: 6 (ref 5.0–8.0)

## 2014-07-15 LAB — LIPID PANEL
CHOLESTEROL: 222 mg/dL — AB (ref 0–200)
HDL: 62.1 mg/dL (ref >39.00)
LDL Cholesterol: 135 mg/dL — ABNORMAL HIGH (ref 0–99)
NonHDL: 159.9
TRIGLYCERIDES: 123 mg/dL (ref 0.0–149.0)
Total CHOL/HDL Ratio: 4
VLDL: 24.6 mg/dL (ref 0.0–40.0)

## 2014-07-15 LAB — TSH: TSH: 0.55 u[IU]/mL (ref 0.35–4.50)

## 2014-07-15 NOTE — Progress Notes (Signed)
Subjective:    Patient ID: Alexandra Hoover, female    DOB: [birth year], 58 y.o.   MRN: [id number]  HPI  Patient presents today for complete physical.  Immunizations: due for tetanus, declines Diet: grows a lot of her own food. Exercise: stays active, biking gardening Colonoscopy: 2012 per patient- unsure of results Dexa:2 years ago.  Pap Smear: due Mammogram: up to date Dental: up to date    Review of Systems  Constitutional:       Wt Readings from Last 3 Encounters: 07/15/14 : 141 lb 8 oz (64.184 kg) 01/02/14 : 148 lb 1.9 oz (67.187 kg) 07/10/13 : 136 lb 1.9 oz (61.744 kg)   HENT: Negative for rhinorrhea.        Notes some issues with certain tones.   Eyes: Negative for visual disturbance.  Respiratory: Negative for cough and shortness of breath.   Cardiovascular: Negative for chest pain and leg swelling.  Gastrointestinal: Negative for nausea, vomiting and diarrhea.  Genitourinary: Negative for dysuria and frequency.  Musculoskeletal: Negative for arthralgias and myalgias.       Occasional leg cramps at night, flexing helps  Skin: Negative for rash.  Neurological: Negative for headaches.  Hematological: Negative for adenopathy.  Psychiatric/Behavioral:       Denies depression/anxiety       Past Medical History  Diagnosis Date  . Thyroid disease     hypothyroid  . History of colon polyps 2009  . 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 Microcalcifications of the breast 2005    left breast    History   Social History  . Marital Status: Married    Spouse Name: N/A    Number of Children: 47  . Years of Education: N/A   Occupational History  .     Social History Main Topics  . Smoking status: Former Research scientist (life sciences)  . Smokeless tobacco: Never Used  . Alcohol Use: 1.0 - 1.5 oz/week    2-3 drink(s) per week  . Drug Use: Not on file  . Sexual Activity: Not on file   Other Topics Concern  . 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 Breast surgery  2005    Left breast biopsy--microcalcifications  . Fracture surgery  12/2009    fib/tib fracture  . Hardware removal  10/2010 and 05/2017    screws removed from ankle    Family History  Problem Relation Age of Onset  . Atrial fibrillation Mother   . Hypertension Mother   . Hypertension Father     died 36 following head injury  . Heart disease Father   . Hypothyroidism Sister   . Cancer Sister 6    breast  . Hyperlipidemia Brother   . Diabetes Paternal Aunt   . Stroke Paternal Uncle   . Hypertension Sister     No Known Allergies  Current Outpatient Prescriptions on File Prior to Visit  Medication Sig Dispense Refill  . levothyroxine (SYNTHROID, LEVOTHROID) 50 MCG tablet Take 1 tablet (50 mcg total) by mouth daily.  30 tablet  5   No current facility-administered medications on file prior to visit.    BP 115/78  Pulse 67  Temp(Src) 98.7 F (37.1 C) (Oral)  Resp 16  Ht 5' 3.5" (1.613 m)  Wt 141 lb 8 oz (64.184 kg)  BMI 24.67 kg/m2  SpO2 99%  LMP 07/19/2009    Objective:   Physical Exam  Physical Exam  Constitutional: She is oriented to person, place, and time. She appears well-developed and well-nourished. No distress.  HENT:  Head: Normocephalic and atraumatic.  Right Ear: Tympanic membrane and ear canal normal.  Left Ear: Tympanic membrane and ear canal normal.  Mouth/Throat: Oropharynx is clear and moist.  Eyes: Pupils are equal, round. No scleral icterus.  Neck: Normal range of motion. No thyromegaly present.  Cardiovascular: Normal rate and regular rhythm.   No murmur heard. Pulmonary/Chest: Effort normal and breath sounds normal.  No respiratory distress. He has no wheezes. She has no rales. She exhibits no tenderness.  Abdominal: Soft. Bowel sounds are normal. He exhibits no distension and no mass. There is no tenderness. There is no rebound and no guarding.  Musculoskeletal: She exhibits no edema.  Lymphadenopathy:    She has no cervical adenopathy.  Neurological: She is alert and oriented to person, place, and time. She has normal patellar reflexes. She exhibits normal muscle tone. Coordination normal.  Skin: Skin is warm and dry.  Psychiatric: She has a normal mood and affect. Her behavior is normal. Judgment and thought content normal.  Breasts: Examined lying Right: Without masses, retractions, discharge or axillary adenopathy.  Left: Without masses, retractions, discharge or axillary adenopathy.  Inguinal/mons: Normal without inguinal adenopathy  External genitalia: Normal  BUS/Urethra/Skene's glands: Normal  Bladder: Normal  Vagina: Normal  Cervix: Normal  Uterus: normal in size, shape and contour. Midline and mobile  Adnexa/parametria:  Rt: Without masses or tenderness.  Lt: Without masses or tenderness.  Anus and perineum: Normal           Assessment & Plan:         Assessment & Plan:

## 2014-07-15 NOTE — Assessment & Plan Note (Addendum)
Continue healthy diet/exercise.  Obtain fasting lab work. Pt will obtain copy of her colonoscopy report and forward to me for review.  Tdap today, declines flu shot. Pap performed today.

## 2014-07-15 NOTE — Progress Notes (Signed)
Pre visit review using our clinic review tool, if applicable. No additional management support is needed unless otherwise documented below in the visit note/SLS  

## 2014-07-15 NOTE — Patient Instructions (Signed)
Please complete lab work prior to leaving.  Follow up in 6 months.  

## 2014-07-15 NOTE — Assessment & Plan Note (Signed)
Will refer to audiology for further evaluation

## 2014-07-16 LAB — CYTOLOGY - PAP

## 2014-07-16 NOTE — Telephone Encounter (Signed)
Colonoscopy entered in [facility] Maintenance history; please advise on records/SLS

## 2014-07-16 NOTE — Telephone Encounter (Signed)
See my chart message. Could you please document historical colonoscopy.

## 2014-07-18 ENCOUNTER — Encounter: Payer: Self-pay | Admitting: Family

## 2014-07-18 ENCOUNTER — Other Ambulatory Visit: Payer: Self-pay | Admitting: Family

## 2014-07-19 MED ORDER — LEVOTHYROXINE SODIUM 50 MCG PO TABS: ORAL_TABLET | ORAL | Status: DC

## 2014-08-07 ENCOUNTER — Encounter: Payer: Self-pay | Admitting: Family

## 2014-08-07 DIAGNOSIS — H903 Sensorineural hearing loss, bilateral: Secondary | ICD-10-CM | POA: Insufficient documentation

## 2014-08-07 HISTORY — DX: Sensorineural hearing loss, bilateral: H90.3

## 2014-08-15 ENCOUNTER — Ambulatory Visit
Admission: RE | Admit: 2014-08-15 | Discharge: 2014-08-15 | Disposition: A | Discharge status: Home / Self Care | Source: Ambulatory Visit | Attending: Family | Admitting: Family

## 2014-08-15 DIAGNOSIS — R921 Mammographic calcification found on diagnostic imaging of breast: Secondary | ICD-10-CM

## 2014-12-05 ENCOUNTER — Other Ambulatory Visit: Payer: Self-pay | Admitting: Family

## 2014-12-05 NOTE — Telephone Encounter (Signed)
Last filled:  07/19/14 Amt: 90, 1  Last OV:  07/15/14 Last TSH: 07/15/14  Med filled x 6 months.

## 2015-01-06 ENCOUNTER — Other Ambulatory Visit: Payer: Self-pay | Admitting: Family

## 2015-01-06 DIAGNOSIS — R921 Mammographic calcification found on diagnostic imaging of breast: Secondary | ICD-10-CM

## 2015-01-13 ENCOUNTER — Ambulatory Visit (INDEPENDENT_AMBULATORY_CARE_PROVIDER_SITE_OTHER): Admitting: Family

## 2015-01-13 ENCOUNTER — Encounter: Payer: Self-pay | Admitting: Family

## 2015-01-13 VITALS — BP 102/70 | HR 71 | Temp 97.9°F | Resp 16 | Ht 63.5 in | Wt 151.4 lb

## 2015-01-13 DIAGNOSIS — E785 Hyperlipidemia, unspecified: Secondary | ICD-10-CM | POA: Diagnosis not present

## 2015-01-13 DIAGNOSIS — E039 Hypothyroidism, unspecified: Secondary | ICD-10-CM | POA: Diagnosis not present

## 2015-01-13 LAB — TSH: TSH: 1.32 u[IU]/mL (ref 0.35–4.50)

## 2015-01-13 MED ORDER — ASPIRIN EC 81 MG PO TBEC: 81.0000 mg | DELAYED_RELEASE_TABLET | Freq: Every day | ORAL | Status: AC

## 2015-01-13 NOTE — Progress Notes (Signed)
Subjective:    Patient ID: Alexandra Hoover, female    DOB: [birth year], 59 y.o.   MRN: [id number]  HPI  Alexandra Hoover is a 59 yr old female who presents today for follow up.  Hypothyroid- maintained on synthroid.  Reports feeling well on current dose of synthroid.   Hyperlipidemia-reports that she is not exercising.  Her LDL goal is <160. Lab Results  Component Value Date   CHOL 222* 07/15/2014   HDL 62.10 07/15/2014   LDLCALC 135* 07/15/2014   TRIG 123.0 07/15/2014   CHOLHDL 4 07/15/2014       Review of Systems See HPI  Past Medical History  Diagnosis Date  . Thyroid disease     hypothyroid  . History of colon polyps 2009  . 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 Microcalcifications of the breast 2005    left breast  . 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 Marital Status: Married    Spouse Name: N/A  . Number of Children: 5  . Years of Education: N/A   Occupational History  .     Social History Main Topics  . Smoking status: Former Research scientist (life sciences)  . Smokeless tobacco: Never Used  . Alcohol Use: 1.0 - 1.5 oz/week    2-3 drink(s) per week  . Drug Use: Not on file  . Sexual Activity: Not on file   Other Topics Concern  . Not on file   Social History Narrative   Regular exercise:  4-5 x weekly   Caffeine use:  2 cups coffee daily   4 living children- son in [place], daughter [place], daughter [place], son [place] ([place]   1 child died of sids   Works as a Chartered certified accountant (telecommutes)                Past Surgical History  Procedure Laterality Date  . Breast surgery  2005    Left breast biopsy--microcalcifications  . Fracture surgery  12/2009    fib/tib fracture  . Hardware removal  10/2010 and 05/2017    screws removed from ankle    Family History  Problem Relation Age of Onset  . Atrial fibrillation Mother   . Hypertension Mother   . Hypertension Father     died 92 following  head injury  . Heart disease Father   . Hypothyroidism Sister   . Cancer Sister 69    breast  . Hyperlipidemia Brother   . Diabetes Paternal Aunt   . Stroke Paternal Uncle   . Hypertension Sister     No Known Allergies  Current Outpatient Prescriptions on File Prior to Visit  Medication Sig Dispense Refill  . levothyroxine (SYNTHROID, LEVOTHROID) 50 MCG tablet TAKE 1 TABLET DAILY 90 tablet 1   No current facility-administered medications on file prior to visit.    BP 102/70 mmHg  Pulse 71  Temp(Src) 97.9 F (36.6 C) (Oral)  Resp 16  Ht 5' 3.5" (1.613 m)  Wt 151 lb 6.4 oz (68.675 kg)  BMI 26.40 kg/m2  SpO2 99%  LMP 07/19/2009       Objective:   Physical Exam  Constitutional: She is oriented to person, place, and time. She appears well-developed and well-nourished.  HENT:  Head: Normocephalic and atraumatic.  Cardiovascular: Normal rate, regular rhythm and normal heart sounds.   No murmur heard. Pulmonary/Chest: Effort normal and breath sounds normal. No respiratory distress.  She has no wheezes.  Neurological: She is alert and oriented to person, place, and time.  Psychiatric: She has a normal mood and affect. Her behavior is normal. Judgment and thought content normal.          Assessment & Plan:

## 2015-01-13 NOTE — Progress Notes (Signed)
Pre visit review using our clinic review tool, if applicable. No additional management support is needed unless otherwise documented below in the visit note. 

## 2015-01-13 NOTE — Assessment & Plan Note (Signed)
Reinforced importance of low fat/low cholesterol diet and exercise.

## 2015-01-13 NOTE — Assessment & Plan Note (Signed)
Obtain follow up TSH.  Continue current dose of synthroid.

## 2015-01-13 NOTE — Patient Instructions (Addendum)
Add aspirin 81 mg once daily for heart health. Complete lab work prior to leaving.  Please follow up in early October for your annual physical.

## 2015-01-14 ENCOUNTER — Encounter: Payer: Self-pay | Admitting: Family

## 2015-02-04 ENCOUNTER — Ambulatory Visit
Admission: RE | Admit: 2015-02-04 | Discharge: 2015-02-04 | Disposition: A | Discharge status: Home / Self Care | Source: Ambulatory Visit | Attending: Family | Admitting: Family

## 2015-02-04 DIAGNOSIS — R921 Mammographic calcification found on diagnostic imaging of breast: Secondary | ICD-10-CM

## 2015-05-14 ENCOUNTER — Other Ambulatory Visit: Payer: Self-pay | Admitting: Family

## 2015-11-08 ENCOUNTER — Telehealth: Payer: Self-pay | Admitting: Family

## 2015-11-10 NOTE — Telephone Encounter (Signed)
LVM advising pt that an appt is needed.    Thanks.

## 2015-11-10 NOTE — Telephone Encounter (Signed)
90 day supply of levothyroxine sent to mail order. Pt last seen by PCP 12/2014 and advised CPE for 07/2015. Pt is past due. Please call pt to schedule fasting CPE before further refills are due. If no availability for CPE before next refill then schedule pt for f/u ASAP and CPE can be scheduled at a later date.  Thanks!

## 2015-12-31 ENCOUNTER — Telehealth: Payer: Self-pay | Admitting: *Deleted

## 2015-12-31 ENCOUNTER — Other Ambulatory Visit: Payer: Self-pay | Admitting: Family

## 2015-12-31 DIAGNOSIS — R921 Mammographic calcification found on diagnostic imaging of breast: Secondary | ICD-10-CM

## 2015-12-31 NOTE — Telephone Encounter (Signed)
Received Orders for [facility]; forwarded to provider/SLS 03/08

## 2016-01-23 ENCOUNTER — Encounter: Payer: Self-pay | Admitting: Family

## 2016-02-05 ENCOUNTER — Ambulatory Visit
Admission: RE | Admit: 2016-02-05 | Discharge: 2016-02-05 | Disposition: A | Discharge status: Home / Self Care | Source: Ambulatory Visit | Attending: Family | Admitting: Family

## 2016-02-05 DIAGNOSIS — R921 Mammographic calcification found on diagnostic imaging of breast: Secondary | ICD-10-CM

## 2016-02-06 ENCOUNTER — Other Ambulatory Visit: Payer: Self-pay | Admitting: Family

## 2016-03-01 ENCOUNTER — Encounter: Payer: Self-pay | Admitting: *Deleted

## 2016-03-01 ENCOUNTER — Telehealth: Payer: Self-pay | Admitting: *Deleted

## 2016-03-01 NOTE — Telephone Encounter (Signed)
Pre-Visit Call completed with patient and chart updated.   Pre-Visit Info documented in Specialty Comments under [employer].    

## 2016-03-02 ENCOUNTER — Encounter: Payer: Self-pay | Admitting: Family

## 2016-03-02 ENCOUNTER — Ambulatory Visit (INDEPENDENT_AMBULATORY_CARE_PROVIDER_SITE_OTHER): Admitting: Family

## 2016-03-02 VITALS — BP 107/63 | HR 56 | Temp 97.8°F | Ht 63.5 in | Wt 142.2 lb

## 2016-03-02 DIAGNOSIS — E2839 Other primary ovarian failure: Secondary | ICD-10-CM | POA: Diagnosis not present

## 2016-03-02 DIAGNOSIS — Z0000 Encounter for general adult medical examination without abnormal findings: Secondary | ICD-10-CM | POA: Diagnosis not present

## 2016-03-02 LAB — HEPATIC FUNCTION PANEL
ALT: 10 U/L (ref 0–35)
AST: 16 U/L (ref 0–37)
Albumin: 4.2 g/dL (ref 3.5–5.2)
Alkaline Phosphatase: 31 U/L — ABNORMAL LOW (ref 39–117)
BILIRUBIN DIRECT: 0.1 mg/dL (ref 0.0–0.3)
TOTAL PROTEIN: 6.8 g/dL (ref 6.0–8.3)
Total Bilirubin: 0.6 mg/dL (ref 0.2–1.2)

## 2016-03-02 LAB — LIPID PANEL
CHOLESTEROL: 218 mg/dL — AB (ref 0–200)
HDL: 70.8 mg/dL (ref >39.00)
LDL CALC: 137 mg/dL — AB (ref 0–99)
NonHDL: 147.31
TRIGLYCERIDES: 53 mg/dL (ref 0.0–149.0)
Total CHOL/HDL Ratio: 3
VLDL: 10.6 mg/dL (ref 0.0–40.0)

## 2016-03-02 LAB — URINALYSIS, ROUTINE W REFLEX MICROSCOPIC
BILIRUBIN URINE: NEGATIVE
HGB URINE DIPSTICK: NEGATIVE
Ketones, ur: NEGATIVE
LEUKOCYTES UA: NEGATIVE
NITRITE: NEGATIVE
RBC / HPF: NONE SEEN (ref 0–?)
Specific Gravity, Urine: 1.01 (ref 1.000–1.030)
TOTAL PROTEIN, URINE-UPE24: NEGATIVE
Urine Glucose: NEGATIVE
Urobilinogen, UA: 0.2 (ref 0.0–1.0)
pH: 6.5 (ref 5.0–8.0)

## 2016-03-02 LAB — CBC WITH DIFFERENTIAL/PLATELET
Basophils Absolute: 0 10*3/uL (ref 0.0–0.1)
Basophils Relative: 0.4 % (ref 0.0–3.0)
EOS ABS: 0.1 10*3/uL (ref 0.0–0.7)
Eosinophils Relative: 2.1 % (ref 0.0–5.0)
HCT: 40.3 % (ref 36.0–46.0)
HEMOGLOBIN: 13.9 g/dL (ref 12.0–15.0)
LYMPHS PCT: 26.5 % (ref 12.0–46.0)
Lymphs Abs: 1.9 10*3/uL (ref 0.7–4.0)
MCHC: 34.3 g/dL (ref 30.0–36.0)
MCV: 88.8 fl (ref 78.0–100.0)
MONOS PCT: 7.8 % (ref 3.0–12.0)
Monocytes Absolute: 0.5 10*3/uL (ref 0.1–1.0)
Neutro Abs: 4.4 10*3/uL (ref 1.4–7.7)
Neutrophils Relative %: 63.2 % (ref 43.0–77.0)
Platelets: 199 10*3/uL (ref 150.0–400.0)
RBC: 4.55 Mil/uL (ref 3.87–5.11)
RDW: 13 % (ref 11.5–15.5)
WBC: 7 10*3/uL (ref 4.0–10.5)

## 2016-03-02 LAB — BASIC METABOLIC PANEL
BUN: 13 mg/dL (ref 6–23)
CALCIUM: 9.5 mg/dL (ref 8.4–10.5)
CO2: 28 mEq/L (ref 19–32)
Chloride: 102 mEq/L (ref 96–112)
Creatinine, Ser: 0.77 mg/dL (ref 0.40–1.20)
GFR: 81.93 mL/min (ref >60.00)
GLUCOSE: 91 mg/dL (ref 70–99)
Potassium: 4 mEq/L (ref 3.5–5.1)
Sodium: 139 mEq/L (ref 135–145)

## 2016-03-02 LAB — TSH: TSH: 2.23 u[IU]/mL (ref 0.35–4.50)

## 2016-03-02 NOTE — Progress Notes (Signed)
Subjective:    Patient ID: Alexandra Hoover, female    DOB: Feb 25, 1956, 60 y.o.   MRN: [id number]  HPI   Patient presents today for complete physical.  Immunizations:  Tetanus up to date Diet: reports healthy diet Wt Readings from Last 3 Encounters:  03/02/16 142 lb 3.2 oz (64.501 kg)  01/13/15 151 lb 6.4 oz (68.675 kg)  07/15/14 141 lb 8 oz (64.184 kg)  Exercise: swims/biking, equipment Colonoscopy:  2013 Dexa: 2012 Pap Smear: 2015 Mammogram: 4/17 Vision: last eye exam 12/16 Dental:   Up to date   Review of Systems  Constitutional: Negative for unexpected weight change.  HENT: Positive for rhinorrhea.        Reports hearing loss is unchanged.  Eyes: Negative for visual disturbance.  Respiratory: Negative for cough.   Cardiovascular: Negative for leg swelling.  Gastrointestinal: Negative for diarrhea and constipation.  Genitourinary: Negative for dysuria and frequency.  Musculoskeletal: Negative for myalgias and arthralgias.  Skin: Negative for rash.  Neurological: Negative for headaches.  Hematological: Negative for adenopathy.  Psychiatric/Behavioral:       Denies depression/anxiety   Past Medical History  Diagnosis Date  . Thyroid disease     hypothyroid  . History of colon polyps 2009  . 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 Microcalcifications of the breast 2005    left breast  . 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 Marital Status: Married    Spouse Name: N/A  . Number of Children: 5  . Years of Education: N/A   Occupational History  .     Social History Main Topics  . Smoking status: Former Research scientist (life sciences)  . Smokeless tobacco: Never Used  . Alcohol Use: 1.0 - 1.5 oz/week    2-3 drink(s) per week  . Drug Use: Not on file  . Sexual Activity: Not on file   Other Topics Concern  . Not on file   Social History Narrative   Regular exercise:  4-5 x weekly   Caffeine use:  2 cups  coffee daily   4 living children- son in [place], daughter [place], daughter [place], son Kyla [place] ([place])   1 child died of sids   Works as a Chartered certified accountant (telecommutes)                Past Surgical History  Procedure Laterality Date  . Breast surgery  2005    Left breast biopsy--microcalcifications  . Fracture surgery  12/2009    fib/tib fracture  . Hardware removal  10/2010 and 05/2017    screws removed from ankle    Family History  Problem Relation Age of Onset  . Atrial fibrillation Mother   . Hypertension Mother   . Hypertension Father     died 35 following head injury  . Heart disease Father   . Hypothyroidism Sister   . Cancer Sister 58    breast  . Hyperlipidemia Brother   . Diabetes Paternal Aunt   . Stroke Paternal Uncle   . Hypertension Sister     Allergies  Allergen Reactions  . Chlorine     Pool water    Current Outpatient Prescriptions on File Prior to Visit  Medication Sig Dispense Refill  . aspirin EC 81 MG tablet Take 1 tablet (81 mg total) by mouth daily.    Marland Kitchen levothyroxine (SYNTHROID, LEVOTHROID) 50 MCG tablet TAKE 1 TABLET DAILY  90 tablet 1  . potassium gluconate 595 (99 K) MG TABS tablet Take 595 mg by mouth daily.    Marland Kitchen triamcinolone cream (KENALOG) 0.1 % Reported on 03/01/2016  0   No current facility-administered medications on file prior to visit.    BP 107/63 mmHg  Pulse 56  Temp(Src) 97.8 F (36.6 C) (Oral)  Ht 5' 3.5" (1.613 m)  Wt 142 lb 3.2 oz (64.501 kg)  BMI 24.79 kg/m2  SpO2 99%  LMP 07/19/2009       Objective:   Physical Exam Physical Exam  Constitutional: She is oriented to person, place, and time. She appears well-developed and well-nourished. No distress.  HENT:  Head: Normocephalic and atraumatic.  Right Ear: Tympanic membrane and ear canal normal.  Left Ear: Tympanic membrane and ear canal normal.  Mouth/Throat: Oropharynx is clear and moist.  Eyes: Pupils are equal, round, and reactive to  light. No scleral icterus.  Neck: Normal range of motion. No thyromegaly present.  Cardiovascular: Normal rate and regular rhythm.   No murmur heard. Pulmonary/Chest: Effort normal and breath sounds normal. No respiratory distress. He has no wheezes. She has no rales. She exhibits no tenderness.  Abdominal: Soft. Bowel sounds are normal. He exhibits no distension and no mass. There is no tenderness. There is no rebound and no guarding.  Musculoskeletal: She exhibits no edema.  Lymphadenopathy:    She has no cervical adenopathy.  Neurological: She is alert and oriented to person, place, and time. She has normal patellar reflexes. She exhibits normal muscle tone. Coordination normal.  Skin: Skin is warm and dry.  Psychiatric: She has a normal mood and affect. Her behavior is normal. Judgment and thought content normal.  Breasts: Examined lying Right: Without masses, retractions, discharge or axillary adenopathy.  Left: Without masses, retractions, discharge or axillary adenopathy. (L nipple inverted)          Assessment & Plan:          Assessment & Plan:  EKG tracing is personally reviewed.  EKG notes NSR.  No acute changes.

## 2016-03-02 NOTE — Progress Notes (Signed)
Pre visit review using our clinic review tool, if applicable. No additional management support is needed unless otherwise documented below in the visit note. 

## 2016-03-02 NOTE — Patient Instructions (Signed)
Please complete lab work prior to leaving. You will be contacted about scheduling your bone density. Keep up the great work with healthy diet, exercise.

## 2016-03-02 NOTE — Assessment & Plan Note (Addendum)
Continue healthy diet and exercise. Refer for dexa. Obtain routine lab work.

## 2016-06-04 ENCOUNTER — Encounter: Payer: Self-pay | Admitting: Family

## 2016-06-04 ENCOUNTER — Ambulatory Visit (INDEPENDENT_AMBULATORY_CARE_PROVIDER_SITE_OTHER): Admitting: Family

## 2016-06-04 ENCOUNTER — Telehealth: Payer: Self-pay | Admitting: Family

## 2016-06-04 VITALS — BP 98/70 | HR 60 | Temp 97.7°F | Resp 16 | Ht 64.0 in | Wt 134.6 lb

## 2016-06-04 DIAGNOSIS — M79644 Pain in right finger(s): Secondary | ICD-10-CM | POA: Diagnosis not present

## 2016-06-04 MED ORDER — MELOXICAM 7.5 MG PO TABS: 7.5000 mg | ORAL_TABLET | Freq: Every day | ORAL | 0 refills | Status: DC

## 2016-06-04 NOTE — Telephone Encounter (Signed)
Could you please check status of bone density scheduling? Pt states she has not been contacted yest.

## 2016-06-04 NOTE — Progress Notes (Signed)
Pre visit review using our clinic review tool, if applicable. No additional management support is needed unless otherwise documented below in the visit note. 

## 2016-06-04 NOTE — Progress Notes (Signed)
Subjective:    Patient ID: Alexandra Hoover, female    DOB: August 01, 1956, 60 y.o.   MRN: [id number]  HPI  Alexandra Hoover is a 60 yr old female who presents today with c/o right thumb pain. Worse with pinching a clothes pin or or trying to lift something.  Pain is not worsened by things such as knitting.  Has been present for several months.  Has tried ibuprofen a few times with minimal improvement.   Review of Systems    see HPI  Past Medical History:  Diagnosis Date  . History of colon polyps 2009  . Microcalcifications of the breast 2005   left breast  . 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 Thyroid disease    hypothyroid     Social History   Social History  . Marital status: Married    Spouse name: N/A  . Number of children: 5  . Years of education: N/A   Occupational History  .  [employer]   Social History Main Topics  . Smoking status: Former Research scientist (life sciences)  . Smokeless tobacco: Never Used  . Alcohol use 1.0 - 1.5 oz/week    2 - 3 drink(s) per week  . Drug use: Unknown  . Sexual activity: Not on file   Other Topics Concern  . 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 BREAST SURGERY  2005   Left breast biopsy--microcalcifications  . FRACTURE SURGERY  12/2009   fib/tib fracture  . HARDWARE REMOVAL  10/2010 and 05/2017   screws removed from ankle    Family History  Problem Relation Age of Onset  . Atrial fibrillation Mother   . Hypertension Mother   . Hypertension Father     died 59 following head injury  . Heart disease Father   . Hypothyroidism Sister   . Cancer  Sister 52    breast  . Hyperlipidemia Brother   . Diabetes Paternal Aunt   . Stroke Paternal Uncle   . Hypertension Sister     Allergies  Allergen Reactions  . Chlorine     Pool water    Current Outpatient Prescriptions on File Prior to Visit  Medication Sig Dispense Refill  . aspirin EC 81 MG tablet Take 1 tablet (81 mg total) by mouth daily.    Marland Kitchen levothyroxine (SYNTHROID, LEVOTHROID) 50 MCG tablet TAKE 1 TABLET DAILY 90 tablet 1  . potassium gluconate 595 (99 K) MG TABS tablet Take 595 mg by mouth daily.     No current facility-administered medications on file prior to visit.     BP 98/70   Pulse 60   Temp 97.7 F (36.5 C) (Oral)   Resp 16   Ht 5\' 4"  (1.626 m)   Wt 134 lb 9.6 oz (61.1 kg)   LMP 07/19/2009   SpO2 98% Comment: room air  BMI 23.10 kg/m    Objective:   Physical Exam  Constitutional: She is oriented to person, place, and time. She appears well-developed  and well-nourished.  Musculoskeletal:  No swelling or tenderness noted at base of right thumb.   Neurological: She is alert and oriented to person, place, and time.  Psychiatric: She has a normal mood and affect. Her behavior is normal. Judgment and thought content normal.          Assessment & Plan:  R thumb pain-New.  trial of meloxicam. If not improvement plan referral to sports medicine.

## 2016-06-04 NOTE — Telephone Encounter (Signed)
Called pt and scheduling at [facility]

## 2016-06-04 NOTE — Patient Instructions (Signed)
Please begin meloxicam once daily for your thumb pain. Call or send me a mychart message if pain worsens or does not improve in 1 week and we will plan to refer you to sports medicine.

## 2016-06-08 ENCOUNTER — Ambulatory Visit (HOSPITAL_BASED_OUTPATIENT_CLINIC_OR_DEPARTMENT_OTHER)
Admission: RE | Admit: 2016-06-08 | Discharge: 2016-06-08 | Disposition: A | Discharge status: Home / Self Care | Source: Ambulatory Visit | Attending: Family | Admitting: Family

## 2016-06-08 DIAGNOSIS — M810 Age-related osteoporosis without current pathological fracture: Secondary | ICD-10-CM | POA: Diagnosis not present

## 2016-06-08 DIAGNOSIS — E2839 Other primary ovarian failure: Secondary | ICD-10-CM | POA: Diagnosis present

## 2016-06-10 ENCOUNTER — Telehealth: Payer: Self-pay | Admitting: Family

## 2016-06-10 ENCOUNTER — Encounter: Payer: Self-pay | Admitting: Family

## 2016-06-10 DIAGNOSIS — M810 Age-related osteoporosis without current pathological fracture: Secondary | ICD-10-CM | POA: Insufficient documentation

## 2016-06-10 NOTE — Telephone Encounter (Signed)
Bone density is showing osteoporosis in her spine.  I would recommend the following. I recommend that you start calcium in the form of of caltrate 600mg  + D one tablet by mouth twice daily. This is available over the counter.  In addition, please ensure regular weight bearing exercise such as walking.  Needs vit D level and I would also recommend once weekly fosamax 70mg  if she is agreeable. Bone density should be repeated in 2 years.

## 2016-06-10 NOTE — Telephone Encounter (Signed)
Noted  

## 2016-06-11 MED ORDER — ALENDRONATE SODIUM 70 MG PO TABS: 70.0000 mg | ORAL_TABLET | ORAL | 1 refills | Status: DC

## 2016-06-11 NOTE — Telephone Encounter (Signed)
Pt emailed back that she wants Fosamax rx to go to [facility]. Rx sent.

## 2016-06-15 ENCOUNTER — Encounter: Payer: Self-pay | Admitting: Family

## 2016-06-17 ENCOUNTER — Other Ambulatory Visit: Payer: Self-pay | Admitting: Family

## 2016-06-17 MED ORDER — MELOXICAM 7.5 MG PO TABS: 7.5000 mg | ORAL_TABLET | Freq: Every day | ORAL | 0 refills | Status: DC

## 2016-06-22 ENCOUNTER — Ambulatory Visit (INDEPENDENT_AMBULATORY_CARE_PROVIDER_SITE_OTHER): Admitting: Family

## 2016-06-22 ENCOUNTER — Encounter: Payer: Self-pay | Admitting: Family

## 2016-06-22 VITALS — BP 120/76 | HR 58 | Temp 97.9°F | Resp 16 | Ht 64.0 in | Wt 136.4 lb

## 2016-06-22 DIAGNOSIS — M810 Age-related osteoporosis without current pathological fracture: Secondary | ICD-10-CM

## 2016-06-22 DIAGNOSIS — M79644 Pain in right finger(s): Secondary | ICD-10-CM | POA: Diagnosis not present

## 2016-06-22 LAB — VITAMIN D 25 HYDROXY (VIT D DEFICIENCY, FRACTURES): VITD: 24.37 ng/mL — AB (ref 30.00–100.00)

## 2016-06-22 MED ORDER — CALCIUM CARBONATE-VITAMIN D 600-400 MG-UNIT PO TABS: 1.0000 | ORAL_TABLET | Freq: Two times a day (BID) | ORAL | Status: AC

## 2016-06-22 NOTE — Progress Notes (Signed)
Subjective:    Patient ID: Alexandra Hoover, female    DOB: Jul 26, 1956, 60 y.o.   MRN: [id number]  HPI  Alexandra Hoover is a 60 yr old female who presents today for follow up.  1) R thumb pain- was given rx for meloxicam last visit. Notes no significant improvement with use of meloxicam.  Pain is worst with squeezing movements such as opening jars and pinching clothes pins etc.  2) Osteoporosis- pt recently given rx for fosamax. Has not yet started fosamax because she wanted to wait to see what her vit D level was.   She returns today for a vitamin D level.   Review of Systems See HPI  Past Medical History:  Diagnosis Date  . History of colon polyps 2009  . Microcalcifications of the breast 2005   left breast  . 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 Thyroid disease    hypothyroid     Social History   Social History  . Marital status: Married    Spouse name: N/A  . Number of children: 5  . Years of education: N/A   Occupational History  .  [employer]   Social History Main Topics  . Smoking status: Former Research scientist (life sciences)  . Smokeless tobacco: Never Used  . Alcohol use 1.0 - 1.5 oz/week    2 - 3 drink(s) per week  . Drug use: Unknown  . Sexual activity: Not on file   Other Topics Concern  . Not on file   Social History Narrative   Regular exercise:  4-5 x weekly   Caffeine use:  2 cups coffee daily   4 living children- son in [place], daughter [place], daughter [place], son Kyla Balzarine ([place])   1 child died of sids   Works as a Chartered certified accountant (telecommutes)                Past Surgical History:  Procedure Laterality Date  . BREAST SURGERY  2005   Left breast biopsy--microcalcifications  . FRACTURE SURGERY  12/2009   fib/tib fracture  . HARDWARE REMOVAL  10/2010 and 05/2017   screws removed from ankle    Family History  Problem Relation Age of Onset  . Atrial  fibrillation Mother   . Hypertension Mother   . Hypertension Father     died 73 following head injury  . Heart disease Father   . Hypothyroidism Sister   . Cancer Sister 78    breast  . Hyperlipidemia Brother   . Diabetes Paternal Aunt   . Stroke Paternal Uncle   . Hypertension Sister     Allergies  Allergen Reactions  . Chlorine     Pool water    Current Outpatient Prescriptions on File Prior to Visit  Medication Sig Dispense Refill  . alendronate (FOSAMAX) 70 MG tablet Take 1 tablet (70 mg total) by mouth every 7 (seven) days. Take with a full glass of water on an empty stomach. 12 tablet 1  . aspirin EC 81 MG tablet Take 1 tablet (81 mg total) by mouth daily.    Marland Kitchen levothyroxine (SYNTHROID, LEVOTHROID) 50 MCG tablet TAKE 1 TABLET DAILY 90 tablet 1  . meloxicam (MOBIC) 7.5 MG tablet Take 1 tablet (7.5 mg total) by mouth daily. 15 tablet 0  . potassium gluconate 595 (99 K) MG TABS tablet Take 595 mg by mouth daily.     No current  facility-administered medications on file prior to visit.     BP 120/76 (BP Location: Right Arm, Patient Position: Sitting, Cuff Size: Normal)   Pulse (!) 58   Temp 97.9 F (36.6 C) (Oral)   Resp 16   Ht 5\' 4"  (1.626 m)   Wt 136 lb 6.4 oz (61.9 kg)   LMP 07/19/2009   SpO2 99%   BMI 23.41 kg/m       Objective:   Physical Exam  Constitutional: She is oriented to person, place, and time. She appears well-developed and well-nourished.  Cardiovascular: Normal rate, regular rhythm and normal heart sounds.   No murmur heard. Pulmonary/Chest: Effort normal and breath sounds normal. No respiratory distress. She has no wheezes.  Musculoskeletal: She exhibits no edema.  Right thumb with out swelling  Neurological: She is alert and oriented to person, place, and time.  Psychiatric: She has a normal mood and affect. Her behavior is normal. Judgment and thought content normal.          Assessment & Plan:  R thumb pain- not improved despite  meloxicam. Will refer to sports medicine for further evaluation.

## 2016-06-22 NOTE — Assessment & Plan Note (Signed)
Discussed importance of exercise, weight bearing exercise, calcium supplement. Obtain vit D level today. Advised pt to start fosamax.

## 2016-06-22 NOTE — Progress Notes (Signed)
Pre visit review using our clinic review tool, if applicable. No additional management support is needed unless otherwise documented below in the visit note. 

## 2016-06-22 NOTE — Patient Instructions (Signed)
Please complete lab work prior to leaving. Begin fosamax.  Continue regular exercise.

## 2016-06-23 ENCOUNTER — Telehealth: Payer: Self-pay | Admitting: Family

## 2016-06-23 DIAGNOSIS — E559 Vitamin D deficiency, unspecified: Secondary | ICD-10-CM

## 2016-06-23 NOTE — Telephone Encounter (Signed)
Vitamin D level is low.  Advise patient to begin vit D 50000 units once weekly for 12 weeks, then repeat vit D level (dx Vit D deficiency).     

## 2016-06-23 NOTE — Telephone Encounter (Signed)
Notified pt and scheduled lab appt for 09/20/16 at [facility]; future order entered.

## 2016-06-25 ENCOUNTER — Encounter: Payer: Self-pay | Admitting: Family

## 2016-06-25 MED ORDER — VITAMIN D (ERGOCALCIFEROL) 1.25 MG (50000 UNIT) PO CAPS: 50000.0000 [IU] | ORAL_CAPSULE | ORAL | 0 refills | Status: DC

## 2016-07-06 ENCOUNTER — Ambulatory Visit (INDEPENDENT_AMBULATORY_CARE_PROVIDER_SITE_OTHER): Admitting: Family Medicine

## 2016-07-06 ENCOUNTER — Encounter: Payer: Self-pay | Admitting: Family Medicine

## 2016-07-06 DIAGNOSIS — M79644 Pain in right finger(s): Secondary | ICD-10-CM | POA: Diagnosis not present

## 2016-07-06 NOTE — Patient Instructions (Signed)
You have a flexor tendinitis of your thumb. Ibuprofen 600mg  three times a day with food OR aleve 2 tabs twice a day with food for pain and inflammation (OR you can take the meloxicam). Brace as often as possible next 6 weeks. Consider occupational therapy, injection if not improving. Heat or ice (whichever feels better) 15 minutes at a time 3-4 times a day. Follow up with me in 6 weeks.

## 2016-07-07 DIAGNOSIS — M79644 Pain in right finger(s): Secondary | ICD-10-CM | POA: Insufficient documentation

## 2016-07-07 NOTE — Assessment & Plan Note (Signed)
consistent with flexor tendinitis or tenosynovitis of thumb.  Not tender at A1 pulley, no catching or locking.  No pain at IP joint or CMC to suggest arthritis.  Tests also negative for de quervains and carpal tunnel.  Discussed regular nsaids, bracing.  Consider occupational therapy, injection if not improving.  F/u in 6 weeks.

## 2016-07-07 NOTE — Progress Notes (Signed)
PCP and consultation requested by: Nance Pear., NP  Subjective:   HPI: Patient is a 60 y.o. female here for right thumb pain.  Patient reports for 4 months she's had pain on palmar aspect of right thumb. She is right handed. Does a lot of knitting, typing but doesn't really seem to bother her when doing these. No known injury or trauma. Pain worse with trying to grip or open something. Pain 0/10 at rest, up to 7/10 and sharp with those motions. No skin changes, numbness. Tried meloxicam with only mild relief.  Past Medical History:  Diagnosis Date  . History of colon polyps 2009  . Microcalcifications of the breast 2005   left breast  . 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 Thyroid disease    hypothyroid    Current Outpatient Prescriptions on File Prior to Visit  Medication Sig Dispense Refill  . alendronate (FOSAMAX) 70 MG tablet Take 1 tablet (70 mg total) by mouth every 7 (seven) days. Take with a full glass of water on an empty stomach. 12 tablet 1  . aspirin EC 81 MG tablet Take 1 tablet (81 mg total) by mouth daily.    . Calcium Carbonate-Vitamin D (CALTRATE 600+D) 600-400 MG-UNIT tablet Take 1 tablet by mouth 2 (two) times daily.    Marland Kitchen levothyroxine (SYNTHROID, LEVOTHROID) 50 MCG tablet TAKE 1 TABLET DAILY 90 tablet 1  . meloxicam (MOBIC) 7.5 MG tablet Take 1 tablet (7.5 mg total) by mouth daily. 15 tablet 0  . potassium gluconate 595 (99 K) MG TABS tablet Take 595 mg by mouth daily.    . Vitamin D, Ergocalciferol, (DRISDOL) 50000 units CAPS capsule Take 1 capsule (50,000 Units total) by mouth every 7 (seven) days. 12 capsule 0   No current facility-administered medications on file prior to visit.     Past Surgical History:  Procedure Laterality Date  . BREAST SURGERY  2005   Left breast biopsy--microcalcifications  . FRACTURE SURGERY  12/2009   fib/tib fracture  . HARDWARE REMOVAL   10/2010 and 05/2017   screws removed from ankle    Allergies  Allergen Reactions  . Chlorine     Pool water    Social History   Social History  . Marital status: Married    Spouse name: N/A  . Number of children: 5  . Years of education: N/A   Occupational History  .  [employer]   Social History Main Topics  . Smoking status: Former Research scientist (life sciences)  . Smokeless tobacco: Never Used  . Alcohol use 1.0 - 1.5 oz/week    2 - 3 drink(s) per week  . Drug use: Unknown  . Sexual activity: Not on file   Other Topics Concern  . Not on file   Social History Narrative   Regular exercise:  4-5 x weekly   Caffeine use:  2 cups coffee daily   4 living children- son in [place], daughter [place], daughter [place], son Kyla [place] ([place])   1 child died of sids   Works as a Chartered certified accountant (telecommutes)                Family History  Problem Relation Age of Onset  . Atrial fibrillation Mother   . Hypertension Mother   . Hypertension Father     died 56 following head injury  . Heart disease Father   . Hypothyroidism Sister   .  Cancer Sister 31    breast  . Hyperlipidemia Brother   . Diabetes Paternal Aunt   . Stroke Paternal Uncle   . Hypertension Sister     BP 103/71   Pulse 66   Ht 5\' 3"  (1.6 m)   Wt 134 lb (60.8 kg)   LMP 07/19/2009   BMI 23.74 kg/m   Review of Systems: See HPI above.    Objective:  Physical Exam:  Gen: NAD, comfortable in exam room  Right hand/wrist: No gross deformity, swelling, bruising.  No atrophy. TTP distal portion of thenar eminence.  No tenderness A1 pulley, at MCP or IP or CMC joints of thumb. FROM at IP and MCP joints. Strength 5/5 all motions of digits, wrist. Collateral ligaments intact of thumb MCP, IP. Negative finkelsteins, tinels. NVI distally.  Left hand: FROM digits without pain.    Assessment & Plan:  1. Right thumb pain - consistent with flexor tendinitis or tenosynovitis of thumb.  Not  tender at A1 pulley, no catching or locking.  No pain at IP joint or CMC to suggest arthritis.  Tests also negative for de quervains and carpal tunnel.  Discussed regular nsaids, bracing.  Consider occupational therapy, injection if not improving.  F/u in 6 weeks.

## 2016-08-17 ENCOUNTER — Ambulatory Visit (INDEPENDENT_AMBULATORY_CARE_PROVIDER_SITE_OTHER): Admitting: Family Medicine

## 2016-08-17 ENCOUNTER — Encounter: Payer: Self-pay | Admitting: Family Medicine

## 2016-08-17 VITALS — BP 127/83 | HR 60 | Ht 63.0 in | Wt 136.0 lb

## 2016-08-17 DIAGNOSIS — M79644 Pain in right finger(s): Secondary | ICD-10-CM | POA: Diagnosis not present

## 2016-08-17 MED ORDER — METHYLPREDNISOLONE ACETATE 40 MG/ML IJ SUSP
20.0000 mg | Freq: Once | INTRAMUSCULAR | Status: AC
  Administered 2016-08-17: 20 mg via INTRA_ARTICULAR

## 2016-08-17 NOTE — Patient Instructions (Signed)
You have a flexor tendinitis, trigger finger of your thumb. Ibuprofen 600mg  three times a day with food OR aleve 2 tabs twice a day with food for pain and inflammation only if needed. You were given an injection today. Brace usually for another week then can discontinue (can stop sooner if this feels good from the shot). Consider occupational therapy, repeat injection if not improving. Heat 15 minutes at a time 3-4 times a day. Follow up with me in 1 month but you can call me sooner.

## 2016-08-19 NOTE — Progress Notes (Signed)
PCP and consultation requested by: Nance Pear., NP  Subjective:   HPI: Patient is a 60 y.o. female here for right thumb pain.  9/12: Patient reports for 4 months she's had pain on palmar aspect of right thumb. She is right handed. Does a lot of knitting, typing but doesn't really seem to bother her when doing these. No known injury or trauma. Pain worse with trying to grip or open something. Pain 0/10 at rest, up to 7/10 and sharp with those motions. No skin changes, numbness. Tried meloxicam with only mild relief.  10/24: Patient reports she feels about the same. Pain is 0/10 at rest. Gets popping of IP joint area with flexion and extension. Problems with gripping and pinching. Thumb feels stiff after wearing the stack splint. No skin changes, numbness.  Past Medical History:  Diagnosis Date  . History of colon polyps 2009  . Microcalcifications of the breast 2005   left breast  . 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 Thyroid disease    hypothyroid    Current Outpatient Prescriptions on File Prior to Visit  Medication Sig Dispense Refill  . alendronate (FOSAMAX) 70 MG tablet Take 1 tablet (70 mg total) by mouth every 7 (seven) days. Take with a full glass of water on an empty stomach. 12 tablet 1  . aspirin EC 81 MG tablet Take 1 tablet (81 mg total) by mouth daily.    . Calcium Carbonate-Vitamin D (CALTRATE 600+D) 600-400 MG-UNIT tablet Take 1 tablet by mouth 2 (two) times daily.    Marland Kitchen levothyroxine (SYNTHROID, LEVOTHROID) 50 MCG tablet TAKE 1 TABLET DAILY 90 tablet 1  . meloxicam (MOBIC) 7.5 MG tablet Take 1 tablet (7.5 mg total) by mouth daily. 15 tablet 0  . potassium gluconate 595 (99 K) MG TABS tablet Take 595 mg by mouth daily.    . Vitamin D, Ergocalciferol, (DRISDOL) 50000 units CAPS capsule Take 1 capsule (50,000 Units total) by mouth every 7 (seven) days. 12 capsule 0   No  current facility-administered medications on file prior to visit.     Past Surgical History:  Procedure Laterality Date  . BREAST SURGERY  2005   Left breast biopsy--microcalcifications  . FRACTURE SURGERY  12/2009   fib/tib fracture  . HARDWARE REMOVAL  10/2010 and 05/2017   screws removed from ankle    Allergies  Allergen Reactions  . Chlorine     Pool water    Social History   Social History  . Marital status: Married    Spouse name: N/A  . Number of children: 5  . Years of education: N/A   Occupational History  .  [employer]   Social History Main Topics  . Smoking status: Former Research scientist (life sciences)  . Smokeless tobacco: Never Used  . Alcohol use 1.0 - 1.5 oz/week    2 - 3 drink(s) per week  . Drug use: Unknown  . Sexual activity: Not on file   Other Topics Concern  . Not on file   Social History Narrative   Regular exercise:  4-5 x weekly   Caffeine use:  2 cups coffee daily   4 living children- son in [place], daughter [place], daughter [place], son Kyla [place] ([place])   1 child died of sids   Works as a Chartered certified accountant (telecommutes)                Family History  Problem Relation Age of Onset  . Atrial fibrillation Mother   . Hypertension Mother   . Hypertension Father     died 2 following head injury  . Heart disease Father   . Hypothyroidism Sister   . Cancer Sister 64    breast  . Hyperlipidemia Brother   . Diabetes Paternal Aunt   . Stroke Paternal Uncle   . Hypertension Sister     BP 127/83   Pulse 60   Ht 5\' 3"  (1.6 m)   Wt 136 lb (61.7 kg)   LMP 07/19/2009   BMI 24.09 kg/m   Review of Systems: See HPI above.    Objective:  Physical Exam:  Gen: NAD, comfortable in exam room  Right hand: No gross deformity, swelling, bruising.  No atrophy. TTP at A1 pulley and just proximal to this.  No other tenderness of hand or thumb. FROM at IP and MCP joints. Strength 5/5 all motions of digits. Collateral  ligaments intact of thumb MCP, IP. Negative finkelsteins, tinels. NVI distally.  Left hand: FROM digits without pain.    Assessment & Plan:  1. Right thumb pain - consistent with flexor tendinitis, trigger thumb.  Has not made progress with splinting.  Went ahead with injection today.  Can brace for a week while awaiting shot to kick in.  Heat.  Consider repeat injection, occupational therapy if not improving.  F/u in 1 month.  After informed written consent patient was seated in exam room.  Area overlying A1 pulley and just proximal to this of right thumb prepped with alcohol swab then injected with 0.5:0.62mL marcaine: depomedrol.  Patient tolerated procedure well without immediate complications.

## 2016-08-19 NOTE — Assessment & Plan Note (Signed)
consistent with flexor tendinitis, trigger thumb.  Has not made progress with splinting.  Went ahead with injection today.  Can brace for a week while awaiting shot to kick in.  Heat.  Consider repeat injection, occupational therapy if not improving.  F/u in 1 month.  After informed written consent patient was seated in exam room.  Area overlying A1 pulley and just proximal to this of right thumb prepped with alcohol swab then injected with 0.5:0.82mL marcaine: depomedrol.  Patient tolerated procedure well without immediate complications.

## 2016-09-20 ENCOUNTER — Telehealth: Payer: Self-pay | Admitting: Family

## 2016-09-20 ENCOUNTER — Other Ambulatory Visit (INDEPENDENT_AMBULATORY_CARE_PROVIDER_SITE_OTHER)

## 2016-09-20 DIAGNOSIS — E559 Vitamin D deficiency, unspecified: Secondary | ICD-10-CM

## 2016-09-20 LAB — VITAMIN D 25 HYDROXY (VIT D DEFICIENCY, FRACTURES): VITD: 60.04 ng/mL (ref 30.00–100.00)

## 2016-09-20 MED ORDER — LEVOTHYROXINE SODIUM 50 MCG PO TABS: 50.0000 ug | ORAL_TABLET | Freq: Every day | ORAL | 1 refills | Status: DC

## 2016-09-20 MED ORDER — VITAMIN D3 75 MCG (3000 UT) PO TABS: 1.0000 | ORAL_TABLET | Freq: Every day | ORAL | Status: AC

## 2016-09-20 NOTE — Telephone Encounter (Signed)
Vit D looks good. D/c weekly supplement, begin vit D 3000 iu once daily. Available OTC.

## 2016-09-20 NOTE — Telephone Encounter (Signed)
Pt notified and made aware.  She in agreement with plan.  While on phone, pt requested a refill on her levothyroxine.  Last TSH: 02/2016.  Medication refilled for 90,1.

## 2016-10-04 ENCOUNTER — Ambulatory Visit (INDEPENDENT_AMBULATORY_CARE_PROVIDER_SITE_OTHER): Admitting: Family

## 2016-10-04 ENCOUNTER — Encounter: Payer: Self-pay | Admitting: Family

## 2016-10-04 VITALS — BP 131/81 | HR 71 | Temp 98.5°F | Resp 16 | Ht 63.0 in | Wt 140.6 lb

## 2016-10-04 DIAGNOSIS — N6320 Unspecified lump in the left breast, unspecified quadrant: Secondary | ICD-10-CM | POA: Diagnosis not present

## 2016-10-04 MED ORDER — AMOXICILLIN-POT CLAVULANATE 875-125 MG PO TABS: 1.0000 | ORAL_TABLET | Freq: Two times a day (BID) | ORAL | 0 refills | Status: AC

## 2016-10-04 NOTE — Progress Notes (Signed)
Pre visit review using our clinic review tool, if applicable. No additional management support is needed unless otherwise documented below in the visit note. 

## 2016-10-04 NOTE — Patient Instructions (Signed)
Please begin augmentin twice daily. Call if increased pain/redness/swelling or fever.

## 2016-10-04 NOTE — Progress Notes (Signed)
Subjective:    Patient ID: Alexandra Hoover, female    DOB: 1955/11/17, 60 y.o.   MRN: [id number]  HPI  Alexandra Hoover is a 60 yr old female who presents today with chief complaint of left nipple pain and lump of left nipple. Reports area is tender and she first noticed 2 days ago. Reports previous hx of breast abscess and drainage.  Her last mamogram was 4/17.   Review of Systems See HPI  Past Medical History:  Diagnosis Date  . History of colon polyps 2009  . Microcalcifications of the breast 2005   left breast  . 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 Thyroid disease    hypothyroid     Social History   Social History  . Marital status: Married    Spouse name: N/A  . Number of children: 5  . Years of education: N/A   Occupational History  .  Alexandra Hoover   Social History Main Topics  . Smoking status: Former Research scientist (life sciences)  . Smokeless tobacco: Never Used  . Alcohol use 1.0 - 1.5 oz/week    2 - 3 drink(s) per week  . Drug use: Unknown  . Sexual activity: Not on file   Other Topics Concern  . Not on file   Social History Narrative   Regular exercise:  4-5 x weekly   Caffeine use:  2 cups coffee daily   4 living children- son in [place], daughter Alexandra Hoover, daughter Alexandra Hoover, son Alexandra Hoover ([place])   1 child died of sids   Works as a Chartered certified accountant (telecommutes)                Past Surgical History:  Procedure Laterality Date  . BREAST SURGERY  2005   Left breast biopsy--microcalcifications  . FRACTURE SURGERY  12/2009   fib/tib fracture  . HARDWARE REMOVAL  10/2010 and 05/2017   screws removed from ankle    Family History  Problem Relation Age of Onset  . Atrial fibrillation Mother   . Hypertension Mother   . Hypertension Father     died 50 following head injury  . Heart disease Father   . Hypothyroidism Sister   . Cancer Sister 11    breast  . Hyperlipidemia  Brother   . Diabetes Paternal Aunt   . Stroke Paternal Uncle   . Hypertension Sister     Allergies  Allergen Reactions  . Chlorine     Pool water    Current Outpatient Prescriptions on File Prior to Visit  Medication Sig Dispense Refill  . alendronate (FOSAMAX) 70 MG tablet Take 1 tablet (70 mg total) by mouth every 7 (seven) days. Take with a full glass of water on an empty stomach. 12 tablet 1  . aspirin EC 81 MG tablet Take 1 tablet (81 mg total) by mouth daily.    . Calcium Carbonate-Vitamin D (CALTRATE 600+D) 600-400 MG-UNIT tablet Take 1 tablet by mouth 2 (two) times daily.    . Cholecalciferol (VITAMIN D3) 3000 units TABS Take 1 tablet by mouth daily. 30 tablet   . levothyroxine (SYNTHROID, LEVOTHROID) 50 MCG tablet Take 1 tablet (50 mcg total) by mouth daily. 90 tablet 1  . potassium gluconate 595 (99 K) MG TABS tablet Take 595 mg by mouth daily.     No current facility-administered medications on file prior to visit.     BP 131/81 (BP Location:  Right Arm, Cuff Size: Normal)   Pulse 71   Temp 98.5 F (36.9 C) (Oral)   Resp 16   Ht 5\' 3"  (1.6 m)   Wt 140 lb 9.6 oz (63.8 kg)   LMP 07/19/2009   SpO2 99%   BMI 24.91 kg/m       Objective:   Physical Exam  Constitutional: She appears well-developed and well-nourished.  Cardiovascular: Normal rate, regular rhythm and normal heart sounds.   No murmur heard. Pulmonary/Chest: Effort normal and breath sounds normal. No respiratory distress. She has no wheezes.  Psychiatric: She has a normal mood and affect. Her behavior is normal. Judgment and thought content normal.  Breast:  Left nipple is retracted (baseline per patient). Some erythema surrounding the left areola medially. Thickening noted beneath left areola, mild tenderness.        Assessment & Plan:  Mastitis/breast mass- suspect that this is infectious etiology. Will initiate augmentin, plan 1 week follow up and will also plan diagnostic mammo and [place] for further  evaluation. She is advised to call I  increased pain/redness/swelling or fever.

## 2016-10-06 ENCOUNTER — Ambulatory Visit: Payer: Self-pay | Admitting: Family

## 2016-10-11 ENCOUNTER — Encounter: Payer: Self-pay | Admitting: Family

## 2016-10-11 ENCOUNTER — Other Ambulatory Visit: Payer: Self-pay | Admitting: Family

## 2016-10-11 NOTE — Telephone Encounter (Signed)
See mychart message. Could you please check status of breast imaging scheduling?

## 2016-10-27 ENCOUNTER — Ambulatory Visit
Admission: RE | Admit: 2016-10-27 | Discharge: 2016-10-27 | Disposition: A | Discharge status: Home / Self Care | Source: Ambulatory Visit | Attending: Family | Admitting: Family

## 2016-10-27 DIAGNOSIS — N6320 Unspecified lump in the left breast, unspecified quadrant: Secondary | ICD-10-CM

## 2017-01-05 NOTE — Addendum Note (Signed)
Addended by: Sherrie George F on: 01/05/2017 08:56 AM   Modules accepted: Orders

## 2017-02-01 ENCOUNTER — Ambulatory Visit: Payer: TRICARE For Life (TFL) | Attending: Family Medicine | Admitting: Occupational Therapy

## 2017-02-01 DIAGNOSIS — M25641 Stiffness of right hand, not elsewhere classified: Secondary | ICD-10-CM | POA: Diagnosis present

## 2017-02-01 DIAGNOSIS — M79644 Pain in right finger(s): Secondary | ICD-10-CM | POA: Insufficient documentation

## 2017-02-01 DIAGNOSIS — M6281 Muscle weakness (generalized): Secondary | ICD-10-CM | POA: Diagnosis present

## 2017-02-01 DIAGNOSIS — M25541 Pain in joints of right hand: Secondary | ICD-10-CM

## 2017-02-01 NOTE — Therapy (Signed)
[facility] [address]  [place], [place], [address] Phone: [phone]   Fax:  [phone]  Occupational Therapy Evaluation  Patient Details  Name: Alexandra Hoover MRN: [id number] Date of Birth: 08-Mar-1956 Referring Provider: Dr. Karlton Lemon  Encounter Date: 02/01/2017      OT End of Session - 02/01/17 1141    Visit Number 1   Number of Visits 17   Date for OT Re-Evaluation 04/02/17   Authorization Type TRIC[employer] - G Code required   Authorization - Visit Number 1   Authorization - Number of Visits 10   OT Start Time 0845   OT Stop Time 0930   OT Time Calculation (min) 45 min   Activity Tolerance Patient tolerated treatment well      Past Medical History:  Diagnosis Date  . History of colon polyps 2009  . Microcalcifications of the breast 2005   left breast  . 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 Thyroid disease    hypothyroid    Past Surgical History:  Procedure Laterality Date  . BREAST SURGERY  2005   Left breast biopsy--microcalcifications  . FRACTURE SURGERY  12/2009   fib/tib fracture  . HARDWARE REMOVAL  10/2010 and 05/2017   screws removed from ankle    There were no vitals filed for this visit.      Subjective Assessment - 02/01/17 0854    Pertinent History osteoporosis, osteopenia   Patient Stated Goals to get rid of the pain and trigerring   Currently in Pain? Yes   Pain Score --  0-9/10   Pain Location --  Thumb   Pain Orientation Right   Pain Descriptors / Indicators Sore   Pain Type Chronic pain   Pain Onset More than a month ago   Pain Frequency Intermittent   Aggravating Factors  use   Pain Relieving Factors rest           [facility] OT Assessment - 02/01/17 0001      Assessment   Diagnosis flexor tendonitis, trigger finger Rt thumb   Referring Provider Dr. Karlton Lemon   Onset Date --  Fall 2017   Prior Therapy none     Precautions   Precautions None     Balance Screen   Has the patient fallen in the past 6 months No   Has the patient had a decrease in activity level because of a fear of falling?  No   Is the patient reluctant to leave their home because of a fear of falling?  No     Home  Environment   Lives With Spouse     Prior Function   Level of Independence Independent   Vocation Full time employment   Vocation Requirements IT - computer work, typing   Leisure knit     ADL   ADL comments Independent with BADLS     IADL   Shopping Takes care of all shopping needs independently   [place], prepares and serves adequate meals independently  Pain with opening jars, cans, bottles   Community Mobility Drives own vehicle  pain w/ turning steering wheel   Medication Management Is responsible for taking medication in correct dosages at correct time     Mobility   Mobility Status Independent     Written Expression   Dominant Hand Right  Handwriting 100% legible  but difficulty/pain holding pen     Vision - History   Baseline Vision Wears glasses all the time     Sensation   Additional Comments denies change     Coordination   9 Hole Peg Test Right;Left   Right 9 Hole Peg Test 24.28 sec   Left 9 Hole Peg Test 22.81 sec     Edema   Edema none, but noted mild arthritis MP joint Rt thumb and IP joint swelling with flexion     ROM / Strength   AROM / PROM / Strength AROM     AROM   Overall AROM Comments BUE AROM WNL's (see below for thumb)     Right Hand AROM   R Thumb MCP 0-60 62 Degrees  Lt - 70    R Thumb IP 0-80 42 Degrees  Lt = 60   R Thumb Radial ABduction/ADduction 0-55 65  Lt = 80   R Thumb Palmar ABduction/ADduction 0-45 70  Lt = 75   R Thumb Opposition to Index --  to 5th digit     Hand Function   Right Hand Grip (lbs) 56 lbs   Right Hand Lateral Pinch 13 lbs   Right Hand 3 Point Pinch 6 lbs    Left Hand Grip (lbs) 47   Left Hand Lateral Pinch 15 lbs   Left 3 point pinch 16 lbs                         OT Education - 02/01/17 1139    Education provided Yes   Education Details Causes of trigger finger, treatment plan, progression of therapy as tolerating, IP flex with full MP extension at thumb   Person(s) Educated Patient   Methods Explanation;Demonstration   Comprehension Verbalized understanding;Returned demonstration          OT Short Term Goals - 02/01/17 1256      OT SHORT TERM GOAL #1   Title Independent with initial HEP (Due 03/03/17)   Time 4   Period Weeks   Status New     OT SHORT TERM GOAL #2   Title Independent with splint wear and care   Time 4   Period Weeks   Status New     OT SHORT TERM GOAL #3   Title Pt to verbalize understanding with pain management strategies and task modifications   Time 4   Period Weeks   Status New     OT SHORT TERM GOAL #4   Title Pt to report less triggering Rt thumb t/o day with functional tasks   Time 4   Period Weeks   Status New     OT SHORT TERM GOAL #5   Title Pt to verbalize understanding with potential A/E needs for writing and opening jars/bottles, etc   Time 4   Period Weeks   Status New           OT Long Term Goals - 02/01/17 1301      OT LONG TERM GOAL #1   Title Independent with updated strengthening HEP (DUE 04/02/17)   Time 8   Period Weeks   Status New     OT LONG TERM GOAL #2   Title Pt to increase lateral pinch strength by 3 lbs or greater, and tip pinch by 5 lbs or greater for functional tasks   Baseline eval: Lateral = 13, tip = 6   Time 8  Period Weeks   Status New     OT LONG TERM GOAL #3   Title Pt to improve thumb MP flexion and IP flexion by 8* or more for functional grasping   Baseline MP flex = 62*, IP flex = 42*   Time 8   Period Weeks   Status New     OT LONG TERM GOAL #4   Title Pt to report pain less than or equal to 3/10 with daily  activities   Time 8   Period Weeks   Status New               Plan - 02/01/17 1143    Clinical Impression Statement Pt is a 20 female who presents to outpatient occupational therapy with flexor tendonitis and trigger finger Rt thumb. Pt presents today with pain, decreased ROM, and strength Rt dominant thumb which affects ability to perform IADLS and work related tasks.    OT Frequency 2x / week   OT Duration 8 weeks   OT Treatment/Interventions Self-care/ADL training;Fluidtherapy;DME and/or AE instruction;Splinting;Patient/family education;Therapeutic exercises;Ultrasound;Therapeutic activities;Cryotherapy;Iontophoresis;Passive range of motion;Parrafin;Manual Therapy   Plan fabricated splint, initiate HEP (ice, P/ROM, blocking MP w/ IP flex)   Consulted and Agree with Plan of Care Patient      Patient will benefit from skilled therapeutic intervention in order to improve the following deficits and impairments:  Increased edema, Pain, Impaired UE functional use, Decreased strength, Decreased range of motion  Visit Diagnosis: Pain in thumb joint with movement of right hand - Plan: Ot plan of care cert/re-cert  Stiffness of joint, hand, right - Plan: Ot plan of care cert/re-cert  Muscle weakness (generalized) - Plan: Ot plan of care cert/re-cert      G-Codes - 71/21/97 1304    Functional Assessment Tool Used (Outpatient only) RUE: lateral pinch = 13 lbs, tip pinch = 6 lbs, pain up to 9/10   Functional Limitation Carrying, moving and handling objects   Carrying, Moving and Handling Objects Current Status [phone]) At least 40 percent but less than 60 percent impaired, limited or restricted   Carrying, Moving and Handling Objects Goal Status ([id number]) At least 1 percent but less than 20 percent impaired, limited or restricted      Problem List Patient Active Problem List   Diagnosis Date Noted  . Pain of right thumb 07/07/2016  . Vitamin D deficiency 06/23/2016  . Osteoporosis  06/10/2016  . Hyperlipidemia 01/13/2015  . 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 Preventative health care 07/15/2014  . Hearing problem 07/15/2014  . Squamous cell carcinoma in situ 09/15/2012  . Hypothyroid 07/19/2012    Carey Bullocks, OTR/L 02/01/2017, 1:09 PM  [facility] [address] [place], [place], [address] Phone: [phone]   Fax:  [phone]  Name: ALEXXA SABET MRN: [id number] Date of Birth: 06-23-1956

## 2017-02-07 ENCOUNTER — Ambulatory Visit: Payer: TRICARE For Life (TFL) | Admitting: Occupational Therapy

## 2017-02-08 ENCOUNTER — Ambulatory Visit: Payer: TRICARE For Life (TFL) | Admitting: Occupational Therapy

## 2017-02-10 ENCOUNTER — Ambulatory Visit: Payer: TRICARE For Life (TFL) | Admitting: Occupational Therapy

## 2017-02-10 DIAGNOSIS — M25641 Stiffness of right hand, not elsewhere classified: Secondary | ICD-10-CM

## 2017-02-10 DIAGNOSIS — M6281 Muscle weakness (generalized): Secondary | ICD-10-CM

## 2017-02-10 DIAGNOSIS — M79644 Pain in right finger(s): Secondary | ICD-10-CM | POA: Diagnosis not present

## 2017-02-10 DIAGNOSIS — M25541 Pain in joints of right hand: Secondary | ICD-10-CM

## 2017-02-10 NOTE — Patient Instructions (Signed)
Your Splint This splint should initially be fitted by a healthcare practitioner.  The healthcare practitioner is responsible for providing wearing instructions and precautions to the patient, other healthcare practitioners and care provider involved in the patient's care.  This splint was custom made for you. Please read the following instructions to learn about wearing and caring for your splint.  Precautions Should your splint cause any of the following problems, remove the splint immediately and contact your therapist/physician.  Swelling  Severe Pain  Pressure Areas  Stiffness  Numbness  Do not wear your splint while operating machinery unless it has been fabricated for that purpose.  When To Wear Your Splint Where your splint according to your therapist/physician instructions. All the time except for bathing, exercising, or washing dishes, monitor skin closely, if pressure areas or redness remove splint and stop wearing it. Bring splint to next therapy session.  Care and Cleaning of Your Splint 1. Keep your splint away from open flames. 2. Your splint will lose its shape in temperatures over 135 degrees Farenheit, ( in car windows, near radiators, ovens or in hot water).  Never make any adjustments to your splint, if the splint needs adjusting remove it and make an appointment to see your therapist. 3. Your splint, including the cushion liner may be cleaned with soap and lukewarm water or alcohol swab.  Do not immerse in hot water over 135 degrees Farenheit. 4. Straps may be washed with soap and water, but do not moisten the self-adhesive portion.     P Flexion (Active Blocked)   Brace thumb below tip joint. Bend joint as far as possible. Repeat __10__ times. Do _4-6___ sessions per day.

## 2017-02-10 NOTE — Therapy (Signed)
[facility] [address] [place], [place], [address] Phone: [phone]   Fax:  [phone]  Occupational Therapy Treatment  Patient Details  Name: Alexandra Hoover MRN: [id number] Date of Birth: Oct 04, 1956 Referring Provider: Dr. Karlton Lemon  Encounter Date: 02/10/2017      OT End of Session - 02/10/17 1705    Visit Number 2   Number of Visits 17   Date for OT Re-Evaluation 04/02/17   Authorization Type [employer] - G Code required   Authorization - Visit Number 2   Authorization - Number of Visits 10   OT Start Time 1020   OT Stop Time 1100   OT Time Calculation (min) 40 min      Past Medical History:  Diagnosis Date  . History of colon polyps 2009  . Microcalcifications of the breast 2005   left breast  . 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 Thyroid disease    hypothyroid    Past Surgical History:  Procedure Laterality Date  . BREAST SURGERY  2005   Left breast biopsy--microcalcifications  . FRACTURE SURGERY  12/2009   fib/tib fracture  . HARDWARE REMOVAL  10/2010 and 05/2017   screws removed from ankle    There were no vitals filed for this visit.      Subjective Assessment - 02/10/17 1020    Subjective  Pt reports pain is better than it was   Pertinent History osteoporosis, osteopenia   Patient Stated Goals to get rid of the pain and trigerring   Currently in Pain? Yes   Pain Score 8    Pain Location --  thumb   Pain Orientation Right   Pain Descriptors / Indicators Aching   Pain Type Chronic pain   Pain Onset More than a month ago   Pain Frequency Intermittent   Aggravating Factors  use   Pain Relieving Factors rest                              OT Education - 02/10/17 1702    Education provided Yes   Education Details splint wear, care, precautions for custom short opponens splint(fabricated  by therapist), IP flexion while blocking MP, importance of avoiding repetative gripping   Person(s) Educated Patient   Methods Explanation;Demonstration;Verbal cues;Handout   Comprehension Verbalized understanding;Returned demonstration;Verbal cues required          OT Short Term Goals - 02/01/17 1256      OT SHORT TERM GOAL #1   Title Independent with initial HEP (Due 03/03/17)   Time 4   Period Weeks   Status New     OT SHORT TERM GOAL #2   Title Independent with splint wear and care   Time 4   Period Weeks   Status New     OT SHORT TERM GOAL #3   Title Pt to verbalize understanding with pain management strategies and task modifications   Time 4   Period Weeks   Status New     OT SHORT TERM GOAL #4   Title Pt to report less triggering Rt thumb t/o day with functional tasks   Time 4   Period Weeks   Status New     OT SHORT TERM GOAL #5   Title Pt to verbalize understanding with potential A/E needs for writing and opening jars/bottles, etc  Time 4   Period Weeks   Status New           OT Long Term Goals - 02/01/17 1301      OT LONG TERM GOAL #1   Title Independent with updated strengthening HEP (DUE 04/02/17)   Time 8   Period Weeks   Status New     OT LONG TERM GOAL #2   Title Pt to increase lateral pinch strength by 3 lbs or greater, and tip pinch by 5 lbs or greater for functional tasks   Baseline eval: Lateral = 13, tip = 6   Time 8   Period Weeks   Status New     OT LONG TERM GOAL #3   Title Pt to improve thumb MP flexion and IP flexion by 8* or more for functional grasping   Baseline MP flex = 62*, IP flex = 42*   Time 8   Period Weeks   Status New     OT LONG TERM GOAL #4   Title Pt to report pain less than or equal to 3/10 with daily activities   Time 8   Period Weeks   Status New               Plan - 02/10/17 1703    Clinical Impression Statement Pt was fitted with a custom short opponens splint fabricated by therapist with IP  joint of thumb free. Pt demonstrates understanding of wear, care and precautions.   Rehab Potential Good   OT Frequency 2x / week   OT Duration 8 weeks   OT Treatment/Interventions Self-care/ADL training;Fluidtherapy;DME and/or AE instruction;Splinting;Patient/family education;Therapeutic exercises;Ultrasound;Therapeutic activities;Cryotherapy;Iontophoresis;Passive range of motion;Parrafin;Manual Therapy   Plan splint check, modalities,    Consulted and Agree with Plan of Care Patient      Patient will benefit from skilled therapeutic intervention in order to improve the following deficits and impairments:  Increased edema, Pain, Impaired UE functional use, Decreased strength, Decreased range of motion  Visit Diagnosis: Pain in thumb joint with movement of right hand  Stiffness of joint, hand, right  Muscle weakness (generalized)    Problem List Patient Active Problem List   Diagnosis Date Noted  . Pain of right thumb 07/07/2016  . Vitamin D deficiency 06/23/2016  . Osteoporosis 06/10/2016  . Hyperlipidemia 01/13/2015  . 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 Preventative health care 07/15/2014  . Hearing problem 07/15/2014  . Squamous cell carcinoma in situ 09/15/2012  . Hypothyroid 07/19/2012    RINE,KATHRYN 02/10/2017, 5:06 PM Theone Murdoch, OTR/L [phone] Phone: [phone] 5:06 PM 02/10/17 [facility] [address] [place], [place], [address] Phone: [phone]   Fax:  [phone]  Name: Alexandra Hoover MRN: [id number] Date of Birth: 31-Oct-1955

## 2017-02-14 ENCOUNTER — Telehealth: Payer: Self-pay | Admitting: Occupational Therapy

## 2017-02-14 ENCOUNTER — Ambulatory Visit: Payer: TRICARE For Life (TFL) | Admitting: Occupational Therapy

## 2017-02-14 DIAGNOSIS — M79644 Pain in right finger(s): Secondary | ICD-10-CM | POA: Diagnosis not present

## 2017-02-14 DIAGNOSIS — M25641 Stiffness of right hand, not elsewhere classified: Secondary | ICD-10-CM

## 2017-02-14 DIAGNOSIS — M25541 Pain in joints of right hand: Secondary | ICD-10-CM

## 2017-02-14 NOTE — Telephone Encounter (Signed)
Agree with adding iontophoresis with Dexamethasone as noted.

## 2017-02-14 NOTE — Telephone Encounter (Signed)
Dr. Barbaraann Barthel  Please sign below order or send via EPIC if you agree:   By signing I understand that I am ordering/authorizing the use of Iontophoresis using 4 mg/mL of dexamethasone as a component of this plan of care.

## 2017-02-14 NOTE — Therapy (Signed)
[facility] [address] [place], [place], [address] Phone: [phone]   Fax:  [phone]  Occupational Therapy Treatment  Patient Details  Name: Alexandra Hoover MRN: [id number] Date of Birth: 04/05/1956 Referring Provider: Dr. Karlton Lemon  Encounter Date: 02/14/2017      OT End of Session - 02/14/17 1317    Visit Number 3   Number of Visits 17   Date for OT Re-Evaluation 04/02/17   Authorization Type TRIC[employer] - G Code required   Authorization - Visit Number 3   Authorization - Number of Visits 10   OT Start Time 8242   OT Stop Time 1305   OT Time Calculation (min) 30 min   Activity Tolerance Patient tolerated treatment well      Past Medical History:  Diagnosis Date  . History of colon polyps 2009  . Microcalcifications of the breast 2005   left breast  . 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 Thyroid disease    hypothyroid    Past Surgical History:  Procedure Laterality Date  . BREAST SURGERY  2005   Left breast biopsy--microcalcifications  . FRACTURE SURGERY  12/2009   fib/tib fracture  . HARDWARE REMOVAL  10/2010 and 05/2017   screws removed from ankle    There were no vitals filed for this visit.      Subjective Assessment - 02/14/17 1237    Subjective  The splint is awkward, but not causing any problems   Patient Stated Goals to get rid of the pain and trigerring   Currently in Pain? No/denies                      OT Treatments/Exercises (OP) - 02/14/17 0001      ADLs   ADL Comments Iontophoresis order not received from MD, therefore will resend request     Exercises   Exercises Hand     Hand Exercises   Other Hand Exercises Reviewed IP flexion A/ROM ex's with MP joint in full extension. Pt performed x 10 reps with no triggering. Pt also instructed to perform P/ROM to thumb in full composite  flexion/extension. Pt return demo     Modalities   Modalities Cryotherapy     Cryotherapy   Number Minutes Cryotherapy 5 Minutes   Cryotherapy Location Hand  base of thumb   Type of Cryotherapy Ice massage                OT Education - 02/14/17 1317    Education provided Yes   Education Details How to correctly perform ice massages at home   Person(s) Educated Patient   Methods Explanation   Comprehension Verbalized understanding          OT Short Term Goals - 02/01/17 1256      OT SHORT TERM GOAL #1   Title Independent with initial HEP (Due 03/03/17)   Time 4   Period Weeks   Status New     OT SHORT TERM GOAL #2   Title Independent with splint wear and care   Time 4   Period Weeks   Status New     OT SHORT TERM GOAL #3   Title Pt to verbalize understanding with pain management strategies and task modifications   Time 4   Period Weeks   Status New     OT SHORT TERM GOAL #4  Title Pt to report less triggering Rt thumb t/o day with functional tasks   Time 4   Period Weeks   Status New     OT SHORT TERM GOAL #5   Title Pt to verbalize understanding with potential A/E needs for writing and opening jars/bottles, etc   Time 4   Period Weeks   Status New           OT Long Term Goals - 02/01/17 1301      OT LONG TERM GOAL #1   Title Independent with updated strengthening HEP (DUE 04/02/17)   Time 8   Period Weeks   Status New     OT LONG TERM GOAL #2   Title Pt to increase lateral pinch strength by 3 lbs or greater, and tip pinch by 5 lbs or greater for functional tasks   Baseline eval: Lateral = 13, tip = 6   Time 8   Period Weeks   Status New     OT LONG TERM GOAL #3   Title Pt to improve thumb MP flexion and IP flexion by 8* or more for functional grasping   Baseline MP flex = 62*, IP flex = 42*   Time 8   Period Weeks   Status New     OT LONG TERM GOAL #4   Title Pt to report pain less than or equal to 3/10 with daily activities    Time 8   Period Weeks   Status New               Plan - 02/14/17 1318    Clinical Impression Statement Pt reports overall less triggerring and pain.    OT Frequency 2x / week   OT Duration 8 weeks   OT Treatment/Interventions Self-care/ADL training;Fluidtherapy;DME and/or AE instruction;Splinting;Patient/family education;Therapeutic exercises;Ultrasound;Therapeutic activities;Cryotherapy;Iontophoresis;Passive range of motion;Parrafin;Manual Therapy   Plan check for iontophoresis order, continue ice, ex's   Consulted and Agree with Plan of Care Patient      Patient will benefit from skilled therapeutic intervention in order to improve the following deficits and impairments:  Increased edema, Pain, Impaired UE functional use, Decreased strength, Decreased range of motion  Visit Diagnosis: Pain in thumb joint with movement of right hand  Stiffness of joint, hand, right    Problem List Patient Active Problem List   Diagnosis Date Noted  . Pain of right thumb 07/07/2016  . Vitamin D deficiency 06/23/2016  . Osteoporosis 06/10/2016  . Hyperlipidemia 01/13/2015  . 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 Preventative health care 07/15/2014  . Hearing problem 07/15/2014  . Squamous cell carcinoma in situ 09/15/2012  . Hypothyroid 07/19/2012    Alexandra Hoover, OTR/L 02/14/2017, 1:19 PM  [facility] [address] [place], [place], [address] Phone: [phone]   Fax:  [phone]  Name: Alexandra Hoover MRN: [id number] Date of Birth: [birth year]

## 2017-02-17 ENCOUNTER — Ambulatory Visit: Payer: TRICARE For Life (TFL) | Admitting: Occupational Therapy

## 2017-02-17 DIAGNOSIS — M25641 Stiffness of right hand, not elsewhere classified: Secondary | ICD-10-CM

## 2017-02-17 DIAGNOSIS — M79644 Pain in right finger(s): Secondary | ICD-10-CM | POA: Diagnosis not present

## 2017-02-17 DIAGNOSIS — M25541 Pain in joints of right hand: Secondary | ICD-10-CM

## 2017-02-17 NOTE — Therapy (Signed)
[facility] [address] [place], [place], [address] Phone: [phone]   Fax:  [phone]  Occupational Therapy Treatment  Patient Details  Name: Alexandra Hoover MRN: [id number] Date of Birth: 10-May-1956 Referring Provider: Dr. Karlton Lemon  Encounter Date: 02/17/2017      OT End of Session - 02/17/17 1705    Visit Number 4   Number of Visits 17   Date for OT Re-Evaluation 04/02/17   Authorization Type [employer] - G Code required   Authorization - Visit Number 4   Authorization - Number of Visits 10   OT Start Time 1405  ice 8 minutes unbillable   OT Stop Time 1448   OT Time Calculation (min) 43 min   Activity Tolerance Patient tolerated treatment well      Past Medical History:  Diagnosis Date  . History of colon polyps 2009  . Microcalcifications of the breast 2005   left breast  . 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 Thyroid disease    hypothyroid    Past Surgical History:  Procedure Laterality Date  . BREAST SURGERY  2005   Left breast biopsy--microcalcifications  . FRACTURE SURGERY  12/2009   fib/tib fracture  . HARDWARE REMOVAL  10/2010 and 05/2017   screws removed from ankle    There were no vitals filed for this visit.      Subjective Assessment - 02/17/17 1438    Subjective  It doesn't trigger much with the splint   Pertinent History osteoporosis, osteopenia   Patient Stated Goals to get rid of the pain and trigerring   Currently in Pain? Yes   Pain Score 5    Pain Location --  thumb   Pain Orientation Right   Pain Descriptors / Indicators Sore   Pain Type Chronic pain   Pain Onset More than a month ago   Pain Frequency Intermittent   Aggravating Factors  use   Pain Relieving Factors rest, splint                      OT Treatments/Exercises (OP) - 02/17/17 0001      ADLs   ADL Comments MD  signed iontophoresis order therefore will begin today     Hand Exercises   Other Hand Exercises Continued IP flexion A/ROM ex's with MP joint in full extension. Pt performed x 10 reps with no triggering. Therapist also performed P/ROM to thumb in full composite flexion/extension.      Modalities   Modalities Iontophoresis     Cryotherapy   Number Minutes Cryotherapy 8 Minutes   Cryotherapy Location Hand  base of thumb   Type of Cryotherapy Ice massage     Iontophoresis   Type of Iontophoresis Dexamethasone   Location volar base of thumb   Dose 1.5 cc, 2.2 mA    Time 18 minutes                OT Education - 02/17/17 1704    Education provided Yes   Education Details precautions/contraindications for iontophoresis   Person(s) Educated Patient   Methods Explanation;Handout   Comprehension Verbalized understanding          OT Short Term Goals - 02/17/17 1705      OT SHORT TERM GOAL #1   Title Independent with initial HEP (Due 03/03/17)   Time 4   Period Weeks  Status Achieved     OT SHORT TERM GOAL #2   Title Independent with splint wear and care   Time 4   Period Weeks   Status Achieved     OT SHORT TERM GOAL #3   Title Pt to verbalize understanding with pain management strategies and task modifications   Time 4   Period Weeks   Status On-going     OT SHORT TERM GOAL #4   Title Pt to report less triggering Rt thumb t/o day with functional tasks   Time 4   Period Weeks   Status Achieved     OT SHORT TERM GOAL #5   Title Pt to verbalize understanding with potential A/E needs for writing and opening jars/bottles, etc   Time 4   Period Weeks   Status On-going           OT Long Term Goals - 02/01/17 1301      OT LONG TERM GOAL #1   Title Independent with updated strengthening HEP (DUE 04/02/17)   Time 8   Period Weeks   Status New     OT LONG TERM GOAL #2   Title Pt to increase lateral pinch strength by 3 lbs or greater, and tip pinch by 5  lbs or greater for functional tasks   Baseline eval: Lateral = 13, tip = 6   Time 8   Period Weeks   Status New     OT LONG TERM GOAL #3   Title Pt to improve thumb MP flexion and IP flexion by 8* or more for functional grasping   Baseline MP flex = 62*, IP flex = 42*   Time 8   Period Weeks   Status New     OT LONG TERM GOAL #4   Title Pt to report pain less than or equal to 3/10 with daily activities   Time 8   Period Weeks   Status New               Plan - 02/17/17 1706    Clinical Impression Statement Pt met 3 STG's and progressing towards remaining STG's. Pt reports overall less triggering and pain   Rehab Potential Good   OT Frequency 2x / week   OT Duration 8 weeks   OT Treatment/Interventions Self-care/ADL training;Fluidtherapy;DME and/or AE instruction;Splinting;Patient/family education;Therapeutic exercises;Ultrasound;Therapeutic activities;Cryotherapy;Iontophoresis;Passive range of motion;Parrafin;Manual Therapy   Plan continue modalities, ionto dose #2      Patient will benefit from skilled therapeutic intervention in order to improve the following deficits and impairments:  Increased edema, Pain, Impaired UE functional use, Decreased strength, Decreased range of motion  Visit Diagnosis: Pain in thumb joint with movement of right hand  Stiffness of joint, hand, right    Problem List Patient Active Problem List   Diagnosis Date Noted  . Pain of right thumb 07/07/2016  . Vitamin D deficiency 06/23/2016  . Osteoporosis 06/10/2016  . Hyperlipidemia 01/13/2015  . 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 Preventative health care 07/15/2014  . Hearing problem 07/15/2014  . Squamous cell carcinoma in situ 09/15/2012  . Hypothyroid 07/19/2012    Carey Bullocks, OTR/L 02/17/2017, 5:08 PM  [facility] [address] [place], [place], [address] Phone: [phone]   Fax:   [phone]  Name: Alexandra Hoover MRN: [id number] Date of Birth: 07-Sep-1956

## 2017-02-21 ENCOUNTER — Ambulatory Visit: Payer: TRICARE For Life (TFL) | Admitting: Occupational Therapy

## 2017-02-21 DIAGNOSIS — M25541 Pain in joints of right hand: Secondary | ICD-10-CM

## 2017-02-21 DIAGNOSIS — M79644 Pain in right finger(s): Secondary | ICD-10-CM | POA: Diagnosis not present

## 2017-02-21 DIAGNOSIS — M25641 Stiffness of right hand, not elsewhere classified: Secondary | ICD-10-CM

## 2017-02-21 NOTE — Therapy (Signed)
[facility] [address] [place], [place], [address] Phone: [phone]   Fax:  [phone]  Occupational Therapy Treatment  Patient Details  Name: Alexandra Hoover MRN: [id number] Date of Birth: [birth year] Referring Provider: Dr. Karlton Lemon  Encounter Date: 02/21/2017      OT End of Session - 02/21/17 1229    Visit Number 5   Number of Visits 17   Date for OT Re-Evaluation 04/02/17   Authorization Type [employer] - G Code required   Authorization - Visit Number 5   Authorization - Number of Visits 10   OT Start Time 1155  Ice 8 minutes   OT Stop Time 1235   OT Time Calculation (min) 40 min   Activity Tolerance Patient tolerated treatment well      Past Medical History:  Diagnosis Date  . History of colon polyps 2009  . Microcalcifications of the breast 2005   left breast  . 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 Thyroid disease    hypothyroid    Past Surgical History:  Procedure Laterality Date  . BREAST SURGERY  2005   Left breast biopsy--microcalcifications  . FRACTURE SURGERY  12/2009   fib/tib fracture  . HARDWARE REMOVAL  10/2010 and 05/2017   screws removed from ankle    There were no vitals filed for this visit.      Subjective Assessment - 02/21/17 1154    Subjective  It doesn't trigger when I have my splint on.    Pertinent History osteoporosis, osteopenia   Patient Stated Goals to get rid of the pain and trigerring   Currently in Pain? No/denies                      OT Treatments/Exercises (OP) - 02/21/17 0001      Cryotherapy   Number Minutes Cryotherapy 8 Minutes   Cryotherapy Location Hand   Type of Cryotherapy Ice massage     Iontophoresis   Type of Iontophoresis Dexamethasone   Location volar base of thumb   Dose 1.5 cc, 2.2 mA    Time 18 minutes  dose #2     Splinting   Splinting Pt  provided with extra strapping for splint     Manual Therapy   Manual therapy comments Therapist performed P/ROM to thumb compositely and IP flexion with MP blocked. Soft tissue mobs at base of thumb                  OT Short Term Goals - 02/17/17 1705      OT SHORT TERM GOAL #1   Title Independent with initial HEP (Due 03/03/17)   Time 4   Period Weeks   Status Achieved     OT SHORT TERM GOAL #2   Title Independent with splint wear and care   Time 4   Period Weeks   Status Achieved     OT SHORT TERM GOAL #3   Title Pt to verbalize understanding with pain management strategies and task modifications   Time 4   Period Weeks   Status On-going     OT SHORT TERM GOAL #4   Title Pt to report less triggering Rt thumb t/o day with functional tasks   Time 4   Period Weeks   Status Achieved     OT SHORT TERM GOAL #5   Title Pt  to verbalize understanding with potential A/E needs for writing and opening jars/bottles, etc   Time 4   Period Weeks   Status On-going           OT Long Term Goals - 02/01/17 1301      OT LONG TERM GOAL #1   Title Independent with updated strengthening HEP (DUE 04/02/17)   Time 8   Period Weeks   Status New     OT LONG TERM GOAL #2   Title Pt to increase lateral pinch strength by 3 lbs or greater, and tip pinch by 5 lbs or greater for functional tasks   Baseline eval: Lateral = 13, tip = 6   Time 8   Period Weeks   Status New     OT LONG TERM GOAL #3   Title Pt to improve thumb MP flexion and IP flexion by 8* or more for functional grasping   Baseline MP flex = 62*, IP flex = 42*   Time 8   Period Weeks   Status New     OT LONG TERM GOAL #4   Title Pt to report pain less than or equal to 3/10 with daily activities   Time 8   Period Weeks   Status New               Plan - 02/21/17 1231    Clinical Impression Statement Pt making steady progress towards goals   Rehab Potential Good   OT Frequency 2x / week   OT  Duration 8 weeks   OT Treatment/Interventions Self-care/ADL training;Fluidtherapy;DME and/or AE instruction;Splinting;Patient/family education;Therapeutic exercises;Ultrasound;Therapeutic activities;Cryotherapy;Iontophoresis;Passive range of motion;Parrafin;Manual Therapy   Plan continue ionto (dose #3), ultrasound pulsed   Consulted and Agree with Plan of Care Patient      Patient will benefit from skilled therapeutic intervention in order to improve the following deficits and impairments:  Increased edema, Pain, Impaired UE functional use, Decreased strength, Decreased range of motion  Visit Diagnosis: Pain in thumb joint with movement of right hand  Stiffness of joint, hand, right    Problem List Patient Active Problem List   Diagnosis Date Noted  . Pain of right thumb 07/07/2016  . Vitamin D deficiency 06/23/2016  . Osteoporosis 06/10/2016  . Hyperlipidemia 01/13/2015  . 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 Preventative health care 07/15/2014  . Hearing problem 07/15/2014  . Squamous cell carcinoma in situ 09/15/2012  . Hypothyroid 07/19/2012    Carey Bullocks, OTR/L  02/21/2017, 12:32 PM  [facility] [address] [place], [place], [address] Phone: [phone]   Fax:  [phone]  Name: Alexandra Hoover MRN: [id number] Date of Birth: [birth year]

## 2017-02-24 ENCOUNTER — Ambulatory Visit: Payer: TRICARE For Life (TFL) | Attending: Family Medicine | Admitting: Occupational Therapy

## 2017-02-24 DIAGNOSIS — M79644 Pain in right finger(s): Secondary | ICD-10-CM | POA: Diagnosis present

## 2017-02-24 DIAGNOSIS — M6281 Muscle weakness (generalized): Secondary | ICD-10-CM | POA: Diagnosis present

## 2017-02-24 DIAGNOSIS — M25641 Stiffness of right hand, not elsewhere classified: Secondary | ICD-10-CM | POA: Diagnosis present

## 2017-02-24 NOTE — Therapy (Signed)
[facility] [address] [place], [place], [address] Phone: [phone]   Fax:  [phone]  Occupational Therapy Treatment  Patient Details  Name: Alexandra Hoover MRN: [id number] Date of Birth: 29-Jul-1956 Referring Provider: Dr. Karlton Lemon  Encounter Date: 02/24/2017      OT End of Session - 02/24/17 1349    Visit Number 6   Number of Visits 17   Date for OT Re-Evaluation 04/02/17   Authorization Type [employer]IC[employer] - G Code required   Authorization - Visit Number 6   Authorization - Number of Visits 10   OT Start Time 0175   OT Stop Time 1403   OT Time Calculation (min) 48 min   Activity Tolerance Patient tolerated treatment well      Past Medical History:  Diagnosis Date  . History of colon polyps 2009  . Microcalcifications of the breast 2005   left breast  . 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 Thyroid disease    hypothyroid    Past Surgical History:  Procedure Laterality Date  . BREAST SURGERY  2005   Left breast biopsy--microcalcifications  . FRACTURE SURGERY  12/2009   fib/tib fracture  . HARDWARE REMOVAL  10/2010 and 05/2017   screws removed from ankle    There were no vitals filed for this visit.      Subjective Assessment - 02/24/17 1320    Subjective  My thumb feels more flexible   Pertinent History osteoporosis, osteopenia   Patient Stated Goals to get rid of the pain and trigerring   Currently in Pain? No/denies  Pt reports its more awareness than pain   Pain Location --  thumb   Pain Orientation Right                      OT Treatments/Exercises (OP) - 02/24/17 0001      Hand Exercises   Other Hand Exercises A/ROM Rt thumb including IP flexion (w/ MP blocked in extension), radial abduction, opposition x 15 reps each. Pt reported no triggering with any of the ex's but did feel more tightness w/  opposition.      Modalities   Modalities Ultrasound     Ultrasound   Ultrasound Location base of thumb around A1 pulley   Ultrasound Parameters 0.8 wts/cm2, 3 Mhz, 20% pulsed x 5 minutes   Ultrasound Goals --  for tightness, inflammation     Iontophoresis   Type of Iontophoresis Dexamethasone   Location volar base of thumb   Dose 1.5 cc, 2.2 mA    Time 18 minutes                  OT Short Term Goals - 02/17/17 1705      OT SHORT TERM GOAL #1   Title Independent with initial HEP (Due 03/03/17)   Time 4   Period Weeks   Status Achieved     OT SHORT TERM GOAL #2   Title Independent with splint wear and care   Time 4   Period Weeks   Status Achieved     OT SHORT TERM GOAL #3   Title Pt to verbalize understanding with pain management strategies and task modifications   Time 4   Period Weeks   Status On-going     OT SHORT TERM GOAL #4   Title Pt to report less triggering Rt  thumb t/o day with functional tasks   Time 4   Period Weeks   Status Achieved     OT SHORT TERM GOAL #5   Title Pt to verbalize understanding with potential A/E needs for writing and opening jars/bottles, etc   Time 4   Period Weeks   Status On-going           OT Long Term Goals - 02/01/17 1301      OT LONG TERM GOAL #1   Title Independent with updated strengthening HEP (DUE 04/02/17)   Time 8   Period Weeks   Status New     OT LONG TERM GOAL #2   Title Pt to increase lateral pinch strength by 3 lbs or greater, and tip pinch by 5 lbs or greater for functional tasks   Baseline eval: Lateral = 13, tip = 6   Time 8   Period Weeks   Status New     OT LONG TERM GOAL #3   Title Pt to improve thumb MP flexion and IP flexion by 8* or more for functional grasping   Baseline MP flex = 62*, IP flex = 42*   Time 8   Period Weeks   Status New     OT LONG TERM GOAL #4   Title Pt to report pain less than or equal to 3/10 with daily activities   Time 8   Period Weeks   Status New                Plan - 02/24/17 1350    Clinical Impression Statement Pt progressing with decr. triggering and little to no pain.    Rehab Potential Good   OT Frequency 2x / week   OT Duration 8 weeks   OT Treatment/Interventions Self-care/ADL training;Fluidtherapy;DME and/or AE instruction;Splinting;Patient/family education;Therapeutic exercises;Ultrasound;Therapeutic activities;Cryotherapy;Iontophoresis;Passive range of motion;Parrafin;Manual Therapy   Plan continue ionto (dose #4), ultrasound pulsed, A/ROM Rt thumb   Consulted and Agree with Plan of Care Patient      Patient will benefit from skilled therapeutic intervention in order to improve the following deficits and impairments:  Increased edema, Pain, Impaired UE functional use, Decreased strength, Decreased range of motion  Visit Diagnosis: Stiffness of joint, hand, right  Muscle weakness (generalized)    Problem List Patient Active Problem List   Diagnosis Date Noted  . Pain of right thumb 07/07/2016  . Vitamin D deficiency 06/23/2016  . Osteoporosis 06/10/2016  . Hyperlipidemia 01/13/2015  . 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 Preventative health care 07/15/2014  . Hearing problem 07/15/2014  . Squamous cell carcinoma in situ 09/15/2012  . Hypothyroid 07/19/2012    Carey Bullocks, OTR/L 02/24/2017, 1:54 PM  [facility] [address] [place], [place], [address] Phone: [phone]   Fax:  [phone]  Name: ALEEZA BELLVILLE MRN: [id number] Date of Birth: May 25, 1956

## 2017-02-28 ENCOUNTER — Ambulatory Visit: Payer: TRICARE For Life (TFL) | Admitting: Occupational Therapy

## 2017-02-28 DIAGNOSIS — M25641 Stiffness of right hand, not elsewhere classified: Secondary | ICD-10-CM

## 2017-02-28 DIAGNOSIS — M25541 Pain in joints of right hand: Secondary | ICD-10-CM

## 2017-02-28 DIAGNOSIS — M6281 Muscle weakness (generalized): Secondary | ICD-10-CM

## 2017-02-28 NOTE — Therapy (Signed)
[facility] [address] [place], [place], [address] Phone: [phone]   Fax:  [phone]  Occupational Therapy Treatment  Patient Details  Name: Alexandra Hoover MRN: [id number] Date of Birth: 13-Nov-1955 Referring Provider: Dr. Karlton Lemon  Encounter Date: 02/28/2017      OT End of Session - 02/28/17 1447    Visit Number 7   Number of Visits 17   Date for OT Re-Evaluation 04/02/17   Authorization Type TRIC[employer] - G Code required   Authorization - Visit Number 7   Authorization - Number of Visits 10   OT Start Time 2248   OT Stop Time 1453   OT Time Calculation (min) 48 min   Activity Tolerance Patient tolerated treatment well   Behavior During Therapy [facility] for tasks assessed/performed      Past Medical History:  Diagnosis Date  . History of colon polyps 2009  . Microcalcifications of the breast 2005   left breast  . 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 Thyroid disease    hypothyroid    Past Surgical History:  Procedure Laterality Date  . BREAST SURGERY  2005   Left breast biopsy--microcalcifications  . FRACTURE SURGERY  12/2009   fib/tib fracture  . HARDWARE REMOVAL  10/2010 and 05/2017   screws removed from ankle    There were no vitals filed for this visit.      Subjective Assessment - 02/28/17 1444    Subjective  Pt reports splint has rubbed her thumb, she is vacuuming and cleaning a lot   Pertinent History osteoporosis, osteopenia   Patient Stated Goals to get rid of the pain and trigerring   Currently in Pain? Yes   Pain Score 3    Pain Location --  thumb   Pain Orientation Right   Pain Descriptors / Indicators Sore   Pain Type Chronic pain   Pain Frequency Intermittent   Aggravating Factors  splint has rubbed it   Pain Relieving Factors removing splint          Treatment:  Hand Exercises   Other Hand  Exercises A/ROM Rt thumb including IP flexion (w/ MP blocked in extension), radial abduction, opposition x 15 reps each. Pt reported no triggering with any of the ex's but did feel more tightness w/ opposition.      Modalities   Modalities Ultrasound     Ultrasound   Ultrasound Location base of thumb/ volar  thumb   Ultrasound Parameters 0.8 wts/cm2, 3 Mhz, 20% pulsed x 8 minutes   Ultrasound Goals --  for tightness, inflammation     Iontophoresis   Type of Iontophoresis Dexamethasone   Location volar base of thumb   Dose 1.5 cc, 2.2 mA    Time 18 minutes     Minor adjustment to pt's splint as pt has a calloused area from thumb rubbing her. Pt reports increased  Comfort following adjustment, thumb stockinette provided. Pt was instructed to sleep in her soft prefab splint.                         OT Short Term Goals - 02/17/17 1705      OT SHORT TERM GOAL #1   Title Independent with initial HEP (Due 03/03/17)   Time 4   Period Weeks   Status Achieved     OT SHORT TERM GOAL #  2   Title Independent with splint wear and care   Time 4   Period Weeks   Status Achieved     OT SHORT TERM GOAL #3   Title Pt to verbalize understanding with pain management strategies and task modifications   Time 4   Period Weeks   Status On-going     OT SHORT TERM GOAL #4   Title Pt to report less triggering Rt thumb t/o day with functional tasks   Time 4   Period Weeks   Status Achieved     OT SHORT TERM GOAL #5   Title Pt to verbalize understanding with potential A/E needs for writing and opening jars/bottles, etc   Time 4   Period Weeks   Status On-going           OT Long Term Goals - 02/01/17 1301      OT LONG TERM GOAL #1   Title Independent with updated strengthening HEP (DUE 04/02/17)   Time 8   Period Weeks   Status New     OT LONG TERM GOAL #2   Title Pt to increase lateral pinch strength by 3 lbs or greater, and tip pinch by 5 lbs or greater for  functional tasks   Baseline eval: Lateral = 13, tip = 6   Time 8   Period Weeks   Status New     OT LONG TERM GOAL #3   Title Pt to improve thumb MP flexion and IP flexion by 8* or more for functional grasping   Baseline MP flex = 62*, IP flex = 42*   Time 8   Period Weeks   Status New     OT LONG TERM GOAL #4   Title Pt to report pain less than or equal to 3/10 with daily activities   Time 8   Period Weeks   Status New               Plan - 02/28/17 1446    Clinical Impression Statement Pt reports decreased triggering and pain. Splint has rubbed an area on thumb, therapist rolled thumb piece back to minimize abrasion and provided pt with thumb stockinette   Rehab Potential Good   OT Frequency 2x / week   OT Duration 8 weeks   OT Treatment/Interventions Self-care/ADL training;Fluidtherapy;DME and/or AE instruction;Splinting;Patient/family education;Therapeutic exercises;Ultrasound;Therapeutic activities;Cryotherapy;Iontophoresis;Passive range of motion;Parrafin;Manual Therapy   Plan continue ionto dose #5, ultrasound, A/ ROM, check splint   Consulted and Agree with Plan of Care Patient      Patient will benefit from skilled therapeutic intervention in order to improve the following deficits and impairments:  Increased edema, Pain, Impaired UE functional use, Decreased strength, Decreased range of motion  Visit Diagnosis: Stiffness of joint, hand, right  Muscle weakness (generalized)  Pain in thumb joint with movement of right hand    Problem List Patient Active Problem List   Diagnosis Date Noted  . Pain of right thumb 07/07/2016  . Vitamin D deficiency 06/23/2016  . Osteoporosis 06/10/2016  . Hyperlipidemia 01/13/2015  . 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 Preventative health care 07/15/2014  . Hearing problem 07/15/2014  . Squamous cell carcinoma in situ 09/15/2012  . Hypothyroid 07/19/2012    Arilla Hice 02/28/2017, 4:21 PM  [facility] [address] [place], [place], [address] Phone: [phone]   Fax:  [phone]  Name: Alexandra Hoover MRN: [id number] Date of Birth: [birth year]

## 2017-03-03 ENCOUNTER — Ambulatory Visit: Payer: TRICARE For Life (TFL) | Admitting: Occupational Therapy

## 2017-03-03 DIAGNOSIS — M25541 Pain in joints of right hand: Secondary | ICD-10-CM

## 2017-03-03 DIAGNOSIS — M25641 Stiffness of right hand, not elsewhere classified: Secondary | ICD-10-CM

## 2017-03-03 NOTE — Therapy (Signed)
[facility] [address] [place], [place], [address] Phone: [phone]   Fax:  [phone]  Occupational Therapy Treatment  Patient Details  Name: Alexandra Hoover MRN: [id number] Date of Birth: Sep 11, 1956 Referring Provider: Dr. Karlton Lemon  Encounter Date: 03/03/2017      OT End of Session - 03/03/17 1450    Visit Number 8   Number of Visits 17   Date for OT Re-Evaluation 04/02/17   Authorization Type [employer] - G Code required   Authorization - Visit Number 8   Authorization - Number of Visits 10   OT Start Time 1400   OT Stop Time 1455   OT Time Calculation (min) 55 min   Activity Tolerance Patient tolerated treatment well      Past Medical History:  Diagnosis Date  . History of colon polyps 2009  . Microcalcifications of the breast 2005   left breast  . 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 Thyroid disease    hypothyroid    Past Surgical History:  Procedure Laterality Date  . BREAST SURGERY  2005   Left breast biopsy--microcalcifications  . FRACTURE SURGERY  12/2009   fib/tib fracture  . HARDWARE REMOVAL  10/2010 and 05/2017   screws removed from ankle    There were no vitals filed for this visit.      Subjective Assessment - 03/03/17 1417    Subjective  Pt reports continued callus area at thumb.    Pertinent History osteoporosis, osteopenia   Patient Stated Goals to get rid of the pain and trigerring   Currently in Pain? No/denies                      OT Treatments/Exercises (OP) - 03/03/17 0001      Ultrasound   Ultrasound Location base of thumb around A1 pulley   Ultrasound Parameters 0.8 wts/cm2, 3 MHz, 20% pulsed x 5 minutes   Ultrasound Goals --  for tightness, inflammations     Iontophoresis   Type of Iontophoresis Dexamethasone   Location volar base of thumb   Dose 1.5 cc, 2.2 mA  (dose #5)     Time 18 minutes     Splinting   Splinting Pt still has callus Rt thumb: provided liner for splint, but also suggested finger sleeve w/ gel padding and told where to go to purchase if liner doesn't work                  OT Short Term Goals - 02/17/17 1705      OT SHORT TERM GOAL #1   Title Independent with initial HEP (Due 03/03/17)   Time 4   Period Weeks   Status Achieved     OT SHORT TERM GOAL #2   Title Independent with splint wear and care   Time 4   Period Weeks   Status Achieved     OT SHORT TERM GOAL #3   Title Pt to verbalize understanding with pain management strategies and task modifications   Time 4   Period Weeks   Status On-going     OT SHORT TERM GOAL #4   Title Pt to report less triggering Rt thumb t/o day with functional tasks   Time 4   Period Weeks   Status Achieved     OT SHORT TERM GOAL #5   Title Pt to  verbalize understanding with potential A/E needs for writing and opening jars/bottles, etc   Time 4   Period Weeks   Status On-going           OT Long Term Goals - 02/01/17 1301      OT LONG TERM GOAL #1   Title Independent with updated strengthening HEP (DUE 04/02/17)   Time 8   Period Weeks   Status New     OT LONG TERM GOAL #2   Title Pt to increase lateral pinch strength by 3 lbs or greater, and tip pinch by 5 lbs or greater for functional tasks   Baseline eval: Lateral = 13, tip = 6   Time 8   Period Weeks   Status New     OT LONG TERM GOAL #3   Title Pt to improve thumb MP flexion and IP flexion by 8* or more for functional grasping   Baseline MP flex = 62*, IP flex = 42*   Time 8   Period Weeks   Status New     OT LONG TERM GOAL #4   Title Pt to report pain less than or equal to 3/10 with daily activities   Time 8   Period Weeks   Status New               Plan - 03/03/17 1454    Clinical Impression Statement Pt reports splint is still bothering her - further adjustments made today.    Rehab  Potential Good   OT Frequency 2x / week   OT Duration 8 weeks   OT Treatment/Interventions Self-care/ADL training;Fluidtherapy;DME and/or AE instruction;Splinting;Patient/family education;Therapeutic exercises;Ultrasound;Therapeutic activities;Cryotherapy;Iontophoresis;Passive range of motion;Parrafin;Manual Therapy   Plan continue with last ionto dose #6, assess remaining STG's and address prn   Consulted and Agree with Plan of Care Patient      Patient will benefit from skilled therapeutic intervention in order to improve the following deficits and impairments:  Increased edema, Pain, Impaired UE functional use, Decreased strength, Decreased range of motion  Visit Diagnosis: Stiffness of joint, hand, right  Pain in thumb joint with movement of right hand    Problem List Patient Active Problem List   Diagnosis Date Noted  . Pain of right thumb 07/07/2016  . Vitamin D deficiency 06/23/2016  . Osteoporosis 06/10/2016  . Hyperlipidemia 01/13/2015  . 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 Preventative health care 07/15/2014  . Hearing problem 07/15/2014  . Squamous cell carcinoma in situ 09/15/2012  . Hypothyroid 07/19/2012    Carey Bullocks, OTR/L 03/03/2017, 3:05 PM  [facility] [address] [place], [place], [address] Phone: [phone]   Fax:  [phone]  Name: Alexandra Hoover MRN: [id number] Date of Birth: 04/23/1956

## 2017-03-07 ENCOUNTER — Ambulatory Visit: Payer: TRICARE For Life (TFL) | Admitting: Occupational Therapy

## 2017-03-07 ENCOUNTER — Other Ambulatory Visit: Payer: Self-pay | Admitting: Family

## 2017-03-07 DIAGNOSIS — M25641 Stiffness of right hand, not elsewhere classified: Secondary | ICD-10-CM

## 2017-03-07 DIAGNOSIS — Z1231 Encounter for screening mammogram for malignant neoplasm of breast: Secondary | ICD-10-CM

## 2017-03-07 NOTE — Therapy (Signed)
[facility] [address] [place], [place], [address] Phone: [phone]   Fax:  [phone]  Occupational Therapy Treatment  Patient Details  Name: Alexandra Hoover MRN: [id number] Date of Birth: 1955/11/13 Referring Provider: Dr. Karlton Lemon  Encounter Date: 03/07/2017      OT End of Session - 03/07/17 1306    Visit Number 9   Number of Visits 17   Date for OT Re-Evaluation 04/02/17   Authorization Type [employer] - G Code required   Authorization - Visit Number 9   Authorization - Number of Visits 10   OT Start Time 1230   OT Stop Time 1315   OT Time Calculation (min) 45 min   Activity Tolerance Patient tolerated treatment well      Past Medical History:  Diagnosis Date  . History of colon polyps 2009  . Microcalcifications of the breast 2005   left breast  . 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 Thyroid disease    hypothyroid    Past Surgical History:  Procedure Laterality Date  . BREAST SURGERY  2005   Left breast biopsy--microcalcifications  . FRACTURE SURGERY  12/2009   fib/tib fracture  . HARDWARE REMOVAL  10/2010 and 05/2017   screws removed from ankle    There were no vitals filed for this visit.      Subjective Assessment - 03/07/17 1230    Subjective  I jammed my thumb over the weekend, but it didn't make it worse   Pertinent History osteoporosis, osteopenia   Patient Stated Goals to get rid of the pain and trigerring   Currently in Pain? No/denies                      OT Treatments/Exercises (OP) - 03/07/17 0001      ADLs   ADL Comments Discussed task modifications and potential A/E needs. Also reviewed pain management strategies     Hand Exercises   Other Hand Exercises A/ROM Rt thumb including IP flexion (w/ MP blocked in extension), radial abduction, flexion, opposition x 10 reps each.       Ultrasound   Ultrasound Location base of thumb around A1 pulley   Ultrasound Parameters 0.8 wts/cm2, 3 Mhz, 20% pulsed x 5 minutes   Ultrasound Goals --  for tightness & inflammation     Iontophoresis   Type of Iontophoresis Dexamethasone   Location volar base of thumb   Dose 1.5 cc, 2.2 mA  (dose #6)    Time 18 minutes                OT Education - 03/07/17 1306    Education provided Yes   Education Details task modifications, potential A/E   Person(s) Educated Patient   Methods Explanation;Demonstration   Comprehension Verbalized understanding          OT Short Term Goals - 03/07/17 1307      OT SHORT TERM GOAL #1   Title Independent with initial HEP (Due 03/03/17)   Time 4   Period Weeks   Status Achieved     OT SHORT TERM GOAL #2   Title Independent with splint wear and care   Time 4   Period Weeks   Status Achieved     OT SHORT TERM GOAL #3   Title Pt to verbalize understanding with pain management strategies and task modifications  Time 4   Period Weeks   Status Achieved     OT SHORT TERM GOAL #4   Title Pt to report less triggering Rt thumb t/o day with functional tasks   Time 4   Period Weeks   Status Achieved     OT SHORT TERM GOAL #5   Title Pt to verbalize understanding with potential A/E needs for writing and opening jars/bottles, etc   Time 4   Period Weeks   Status Achieved           OT Long Term Goals - 02/01/17 1301      OT LONG TERM GOAL #1   Title Independent with updated strengthening HEP (DUE 04/02/17)   Time 8   Period Weeks   Status New     OT LONG TERM GOAL #2   Title Pt to increase lateral pinch strength by 3 lbs or greater, and tip pinch by 5 lbs or greater for functional tasks   Baseline eval: Lateral = 13, tip = 6   Time 8   Period Weeks   Status New     OT LONG TERM GOAL #3   Title Pt to improve thumb MP flexion and IP flexion by 8* or more for functional grasping   Baseline MP flex = 62*, IP flex = 42*    Time 8   Period Weeks   Status New     OT LONG TERM GOAL #4   Title Pt to report pain less than or equal to 3/10 with daily activities   Time 8   Period Weeks   Status New               Plan - 03/07/17 1309    Clinical Impression Statement Pt met all STG's. Pt tolerating thumb A/ROM without triggering   Rehab Potential Good   OT Frequency 2x / week   OT Duration 8 weeks   OT Treatment/Interventions Self-care/ADL training;Fluidtherapy;DME and/or AE instruction;Splinting;Patient/family education;Therapeutic exercises;Ultrasound;Therapeutic activities;Cryotherapy;Iontophoresis;Passive range of motion;Parrafin;Manual Therapy   Plan G-code, continue [place], begin light thumb and grip strengthening   Consulted and Agree with Plan of Care Patient      Patient will benefit from skilled therapeutic intervention in order to improve the following deficits and impairments:  Increased edema, Pain, Impaired UE functional use, Decreased strength, Decreased range of motion  Visit Diagnosis: Stiffness of joint, hand, right    Problem List Patient Active Problem List   Diagnosis Date Noted  . Pain of right thumb 07/07/2016  . Vitamin D deficiency 06/23/2016  . Osteoporosis 06/10/2016  . Hyperlipidemia 01/13/2015  . 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 Preventative health care 07/15/2014  . Hearing problem 07/15/2014  . Squamous cell carcinoma in situ 09/15/2012  . Hypothyroid 07/19/2012    Carey Bullocks, OTR/L 03/07/2017, 1:20 PM  [facility] [address] [place], [place], [address] Phone: [phone]   Fax:  [phone]  Name: Alexandra Hoover MRN: [id number] Date of Birth: 1956-05-22

## 2017-03-09 ENCOUNTER — Encounter: Payer: Self-pay | Admitting: Family

## 2017-03-10 ENCOUNTER — Ambulatory Visit: Payer: TRICARE For Life (TFL) | Admitting: Occupational Therapy

## 2017-03-10 DIAGNOSIS — M25541 Pain in joints of right hand: Secondary | ICD-10-CM

## 2017-03-10 DIAGNOSIS — M25641 Stiffness of right hand, not elsewhere classified: Secondary | ICD-10-CM | POA: Diagnosis not present

## 2017-03-10 DIAGNOSIS — M6281 Muscle weakness (generalized): Secondary | ICD-10-CM

## 2017-03-10 MED ORDER — LEVOTHYROXINE SODIUM 50 MCG PO TABS: 50.0000 ug | ORAL_TABLET | Freq: Every day | ORAL | 0 refills | Status: AC

## 2017-03-10 NOTE — Patient Instructions (Signed)
1. Grip Strengthening (Resistive Putty)   Squeeze putty using thumb and all fingers. Repeat _15___ times. Do __2__ sessions per day.   2. Lateral Pinch Strengthening (Resistive Putty)    Squeeze between thumb and side of each finger in turn. Thumb on top  Repeat _10___ times. Do __2__ sessions per day.   3. Pinch: Palmar    Roll putty into thick tube. Pinch putty with right thumb and index finger, then right thumb and long finger (5 reps each finger)  Repeat __10__ times. Do __2__ sessions per day.

## 2017-03-10 NOTE — Therapy (Signed)
[facility] [address] [place], [place], [address] Phone: [phone]   Fax:  [phone]  Occupational Therapy Treatment  Patient Details  Name: Alexandra Hoover MRN: [id number] Date of Birth: Feb 11, 1956 Referring Provider: Dr. Karlton Lemon  Encounter Date: 03/10/2017      OT End of Session - 03/10/17 1509    Visit Number 10   Number of Visits 17   Date for OT Re-Evaluation 04/02/17   Authorization Type [employer] - G Code required   Authorization - Visit Number 10   Authorization - Number of Visits 10   OT Start Time 1315  8 min. ice unbillable   OT Stop Time 1355   OT Time Calculation (min) 40 min   Activity Tolerance Patient tolerated treatment well      Past Medical History:  Diagnosis Date  . History of colon polyps 2009  . Microcalcifications of the breast 2005   left breast  . 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 Thyroid disease    hypothyroid    Past Surgical History:  Procedure Laterality Date  . BREAST SURGERY  2005   Left breast biopsy--microcalcifications  . FRACTURE SURGERY  12/2009   fib/tib fracture  . HARDWARE REMOVAL  10/2010 and 05/2017   screws removed from ankle    There were no vitals filed for this visit.      Subjective Assessment - 03/10/17 1318    Subjective  I haven't had any triggering   Pertinent History osteoporosis, osteopenia   Patient Stated Goals to get rid of the pain and trigerring   Currently in Pain? No/denies                      OT Treatments/Exercises (OP) - 03/10/17 0001      Hand Exercises   Other Hand Exercises Putty HEP issued with yellow resistance putty - see pt instructions for details     Cryotherapy   Number Minutes Cryotherapy 8 Minutes   Cryotherapy Location --  BASE OF THUMB   Type of Cryotherapy Ice massage  after putty ex's     Ultrasound   Ultrasound Location base of thumb around A1 pulley  at beginning of session   Ultrasound Parameters 0.8 wts/cm2, 3 Mhz, 20% pulsed, x 8 minutes   Ultrasound Goals --  tightness and inflammation                OT Education - 03/10/17 1346    Education provided Yes   Education Details Putty HEP    Person(s) Educated Patient   Methods Explanation;Demonstration;Handout   Comprehension Verbalized understanding;Returned demonstration          OT Short Term Goals - 03/07/17 1307      OT SHORT TERM GOAL #1   Title Independent with initial HEP (Due 03/03/17)   Time 4   Period Weeks   Status Achieved     OT SHORT TERM GOAL #2   Title Independent with splint wear and care   Time 4   Period Weeks   Status Achieved     OT SHORT TERM GOAL #3   Title Pt to verbalize understanding with pain management strategies and task modifications   Time 4   Period Weeks   Status Achieved     OT SHORT TERM GOAL #4   Title Pt to report less triggering Rt thumb  t/o day with functional tasks   Time 4   Period Weeks   Status Achieved     OT SHORT TERM GOAL #5   Title Pt to verbalize understanding with potential A/E needs for writing and opening jars/bottles, etc   Time 4   Period Weeks   Status Achieved           OT Long Term Goals - 02/01/17 1301      OT LONG TERM GOAL #1   Title Independent with updated strengthening HEP (DUE 04/02/17)   Time 8   Period Weeks   Status New     OT LONG TERM GOAL #2   Title Pt to increase lateral pinch strength by 3 lbs or greater, and tip pinch by 5 lbs or greater for functional tasks   Baseline eval: Lateral = 13, tip = 6   Time 8   Period Weeks   Status New     OT LONG TERM GOAL #3   Title Pt to improve thumb MP flexion and IP flexion by 8* or more for functional grasping   Baseline MP flex = 62*, IP flex = 42*   Time 8   Period Weeks   Status New     OT LONG TERM GOAL #4   Title Pt to report pain less than or equal to 3/10 with  daily activities   Time 8   Period Weeks   Status New               Plan - Mar 21, 2017 1510    Clinical Impression Statement Pt issued putty HEP today. Pt has lost 1 lb. in lateral and 3 tip pinch strength because we have prevented these activities per protocol until now.    Rehab Potential Good   OT Frequency 2x / week   OT Duration 8 weeks   OT Treatment/Interventions Self-care/ADL training;Fluidtherapy;DME and/or AE instruction;Splinting;Patient/family education;Therapeutic exercises;Ultrasound;Therapeutic activities;Cryotherapy;Iontophoresis;Passive range of motion;Parrafin;Manual Therapy   Plan continue [place], gentle strengthening   Consulted and Agree with Plan of Care Patient      Patient will benefit from skilled therapeutic intervention in order to improve the following deficits and impairments:  Increased edema, Pain, Impaired UE functional use, Decreased strength, Decreased range of motion  Visit Diagnosis: Stiffness of joint, hand, right  Pain in thumb joint with movement of right hand  Muscle weakness (generalized)      G-Codes - 21-Mar-2017 1513    Functional Assessment Tool Used (Outpatient only) RUE: lateral pinch = 12 lbs, tip pinch = 5 lbs, pain mostly 0/10, but occasionally up to 5/10    Functional Limitation Carrying, moving and handling objects   Carrying, Moving and Handling Objects Current Status [phone]) At least 40 percent but less than 60 percent impaired, limited or restricted   Carrying, Moving and Handling Objects Goal Status ([id number]) At least 1 percent but less than 20 percent impaired, limited or restricted      Problem List Patient Active Problem List   Diagnosis Date Noted  . Pain of right thumb 07/07/2016  . Vitamin D deficiency 06/23/2016  . Osteoporosis 06/10/2016  . Hyperlipidemia 01/13/2015  . 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 Preventative health care 07/15/2014  . Hearing problem 07/15/2014  . Squamous cell  carcinoma in situ 09/15/2012  . Hypothyroid 07/19/2012    Occupational Therapy Progress Note  Dates of Reporting Period: 02/01/17 to 03/21/17  Objective Reports of Subjective Statement: Pt reports less pain going from 9/10  to 0/10 on average. Pt occasionally with pain up to 5/10  Objective Measurements: Lateral pinch 12 lbs, tip pinch 5 lbs  Goal Update: see above -  Pt has met all STG's  Plan: see above   Reason Skilled Services are Required: To progress Rt hand grip and pinch strength, decrease pain, and prevent triggering, as well as education in task modifications, and adaptive strategies and/or A/E retraining.    Carey Bullocks, OTR/L 03/10/2017, 3:14 PM  [facility] [address] [place], [place], [address] Phone: [phone]   Fax:  [phone]  Name: LOUISIANA SEARLES MRN: [id number] Date of Birth: 05/21/1956

## 2017-03-14 ENCOUNTER — Ambulatory Visit: Payer: TRICARE For Life (TFL) | Admitting: Occupational Therapy

## 2017-03-14 DIAGNOSIS — M25641 Stiffness of right hand, not elsewhere classified: Secondary | ICD-10-CM | POA: Diagnosis not present

## 2017-03-14 DIAGNOSIS — M6281 Muscle weakness (generalized): Secondary | ICD-10-CM

## 2017-03-14 DIAGNOSIS — M25541 Pain in joints of right hand: Secondary | ICD-10-CM

## 2017-03-14 NOTE — Therapy (Signed)
[facility] [address] [place], [place], [address] Phone: [phone]   Fax:  [phone]  Occupational Therapy Treatment  Patient Details  Name: Alexandra Hoover MRN: [id number] Date of Birth: 01/14/1956 Referring Provider: Dr. Karlton Lemon  Encounter Date: 03/14/2017      OT End of Session - 03/14/17 1335    Visit Number 11   Number of Visits 17   Date for OT Re-Evaluation 04/02/17   Authorization Type [employer] - G Code required   Authorization - Visit Number 11   Authorization - Number of Visits 20   OT Start Time 1230  8 min. of ice unbillable   OT Stop Time 1310   OT Time Calculation (min) 40 min   Activity Tolerance Patient tolerated treatment well      Past Medical History:  Diagnosis Date  . History of colon polyps 2009  . Microcalcifications of the breast 2005   left breast  . 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 Thyroid disease    hypothyroid    Past Surgical History:  Procedure Laterality Date  . BREAST SURGERY  2005   Left breast biopsy--microcalcifications  . FRACTURE SURGERY  12/2009   fib/tib fracture  . HARDWARE REMOVAL  10/2010 and 05/2017   screws removed from ankle    There were no vitals filed for this visit.      Subjective Assessment - 03/14/17 1233    Subjective  I haven't had any triggering since starting the putty   Pertinent History osteoporosis, osteopenia   Patient Stated Goals to get rid of the pain and trigerring   Currently in Pain? Yes   Pain Score 3    Pain Location --  thumb   Pain Orientation Right   Pain Descriptors / Indicators Sore   Pain Type Chronic pain   Pain Onset More than a month ago   Pain Frequency Intermittent   Aggravating Factors  Pt reports the soreness is probably more from the splint than the trigger finger   Pain Relieving Factors removing splint                       OT Treatments/Exercises (OP) - 03/14/17 0001      Hand Exercises   Other Hand Exercises Reviewed putty HEP - increased reps on tip pinch for each finger to 10 reps each   Other Hand Exercises Working towards increasing pinch strength with clothespin activity: yellow to red resistance. Gripper set at level 1 resistance to pick up blocks for sustained grip strength Rt hand with 1 rest break to stretch hand/thumb (as suggested by therapist)      Cryotherapy   Number Minutes Cryotherapy 8 Minutes   Cryotherapy Location --  base of thumb   Type of Cryotherapy Ice massage  at end of session     Ultrasound   Ultrasound Location base of thumb around A1 pulley   Ultrasound Parameters 0.8 wts/cm2, 20%, pulsed x 8 minutes   Ultrasound Goals --  tightness, inflammation                  OT Short Term Goals - 03/07/17 1307      OT SHORT TERM GOAL #1   Title Independent with initial HEP (Due 03/03/17)   Time 4   Period Weeks   Status Achieved     OT SHORT  TERM GOAL #2   Title Independent with splint wear and care   Time 4   Period Weeks   Status Achieved     OT SHORT TERM GOAL #3   Title Pt to verbalize understanding with pain management strategies and task modifications   Time 4   Period Weeks   Status Achieved     OT SHORT TERM GOAL #4   Title Pt to report less triggering Rt thumb t/o day with functional tasks   Time 4   Period Weeks   Status Achieved     OT SHORT TERM GOAL #5   Title Pt to verbalize understanding with potential A/E needs for writing and opening jars/bottles, etc   Time 4   Period Weeks   Status Achieved           OT Long Term Goals - 03/14/17 1337      OT LONG TERM GOAL #1   Title Independent with updated strengthening HEP (DUE 04/02/17)   Time 8   Period Weeks   Status Achieved     OT LONG TERM GOAL #2   Title Pt to increase lateral pinch strength by 3 lbs or greater, and tip pinch by 5 lbs or greater  for functional tasks   Baseline eval: Lateral = 13, tip = 6   Time 8   Period Weeks   Status On-going     OT LONG TERM GOAL #3   Title Pt to improve thumb MP flexion and IP flexion by 8* or more for functional grasping   Baseline MP flex = 62*, IP flex = 42*   Time 8   Period Weeks   Status On-going     OT LONG TERM GOAL #4   Title Pt to report pain less than or equal to 3/10 with daily activities   Time 8   Period Weeks   Status On-going               Plan - 03/14/17 1337    Clinical Impression Statement Pt progressing towards LTG's: gradually working on grip and pinch strength with no triggering. Pt also instructed to gradually wean from splint   Rehab Potential Good   OT Frequency 2x / week   OT Duration 8 weeks   OT Treatment/Interventions Self-care/ADL training;Fluidtherapy;DME and/or AE instruction;Splinting;Patient/family education;Therapeutic exercises;Ultrasound;Therapeutic activities;Cryotherapy;Iontophoresis;Passive range of motion;Parrafin;Manual Therapy   Plan continue [place], gentle strengthening (anticipate gradually incr. resistance next week if continued no trigerring)    Consulted and Agree with Plan of Care Patient      Patient will benefit from skilled therapeutic intervention in order to improve the following deficits and impairments:  Increased edema, Pain, Impaired UE functional use, Decreased strength, Decreased range of motion  Visit Diagnosis: Stiffness of joint, hand, right  Pain in thumb joint with movement of right hand  Muscle weakness (generalized)    Problem List Patient Active Problem List   Diagnosis Date Noted  . Pain of right thumb 07/07/2016  . Vitamin D deficiency 06/23/2016  . Osteoporosis 06/10/2016  . Hyperlipidemia 01/13/2015  . 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 Preventative health care 07/15/2014  . Hearing problem 07/15/2014  . Squamous cell carcinoma in situ 09/15/2012  . Hypothyroid 07/19/2012     Carey Bullocks, OTR/L 03/14/2017, 1:39 PM  [facility] [address] [place], [place], [address] Phone: [phone]   Fax:  [phone]  Name: JUDEA FENNIMORE MRN: [id number] Date  of Birth: [birth year]

## 2017-03-17 ENCOUNTER — Ambulatory Visit: Payer: TRICARE For Life (TFL) | Admitting: Occupational Therapy

## 2017-03-17 DIAGNOSIS — M25641 Stiffness of right hand, not elsewhere classified: Secondary | ICD-10-CM | POA: Diagnosis not present

## 2017-03-17 DIAGNOSIS — M6281 Muscle weakness (generalized): Secondary | ICD-10-CM

## 2017-03-17 NOTE — Therapy (Signed)
[facility] [address] [place], [place], [address] Phone: [phone]   Fax:  [phone]  Occupational Therapy Treatment  Patient Details  Name: Alexandra Hoover MRN: [id number] Date of Birth: Jun 08, 1956 Referring Provider: Dr. Karlton Lemon  Encounter Date: 03/17/2017      OT End of Session - 03/17/17 1612    Visit Number 12   Number of Visits 17   Date for OT Re-Evaluation 04/02/17   Authorization Type [employer] - G Code required   Authorization - Visit Number 12   Authorization - Number of Visits 20   OT Start Time 8110   OT Stop Time 1605   OT Time Calculation (min) 35 min   Activity Tolerance Patient tolerated treatment well      Past Medical History:  Diagnosis Date  . History of colon polyps 2009  . Microcalcifications of the breast 2005   left breast  . 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 Thyroid disease    hypothyroid    Past Surgical History:  Procedure Laterality Date  . BREAST SURGERY  2005   Left breast biopsy--microcalcifications  . FRACTURE SURGERY  12/2009   fib/tib fracture  . HARDWARE REMOVAL  10/2010 and 05/2017   screws removed from ankle    There were no vitals filed for this visit.      Subjective Assessment - 03/17/17 1541    Subjective  No triggering   Pertinent History osteoporosis, osteopenia   Patient Stated Goals to get rid of the pain and trigerring   Currently in Pain? No/denies                      OT Treatments/Exercises (OP) - 03/17/17 0001      Hand Exercises   Other Hand Exercises Working  pinch strength with clothespin activity: yellow to red resistance. Gripper set at level 1 resistance to pick up blocks for sustained grip strength Rt hand without rest break     Ultrasound   Ultrasound Location base of thumb around A1 pulley   Ultrasound Parameters 0.8 wts/cm2, 3 Mhz, 20%  pulsed x 5 minutes   Ultrasound Goals --  inflammation, tightness                  OT Short Term Goals - 03/07/17 1307      OT SHORT TERM GOAL #1   Title Independent with initial HEP (Due 03/03/17)   Time 4   Period Weeks   Status Achieved     OT SHORT TERM GOAL #2   Title Independent with splint wear and care   Time 4   Period Weeks   Status Achieved     OT SHORT TERM GOAL #3   Title Pt to verbalize understanding with pain management strategies and task modifications   Time 4   Period Weeks   Status Achieved     OT SHORT TERM GOAL #4   Title Pt to report less triggering Rt thumb t/o day with functional tasks   Time 4   Period Weeks   Status Achieved     OT SHORT TERM GOAL #5   Title Pt to verbalize understanding with potential A/E needs for writing and opening jars/bottles, etc   Time 4   Period Weeks   Status Achieved           OT Long Term  Goals - 03/14/17 1337      OT LONG TERM GOAL #1   Title Independent with updated strengthening HEP (DUE 04/02/17)   Time 8   Period Weeks   Status Achieved     OT LONG TERM GOAL #2   Title Pt to increase lateral pinch strength by 3 lbs or greater, and tip pinch by 5 lbs or greater for functional tasks   Baseline eval: Lateral = 13, tip = 6   Time 8   Period Weeks   Status On-going     OT LONG TERM GOAL #3   Title Pt to improve thumb MP flexion and IP flexion by 8* or more for functional grasping   Baseline MP flex = 62*, IP flex = 42*   Time 8   Period Weeks   Status On-going     OT LONG TERM GOAL #4   Title Pt to report pain less than or equal to 3/10 with daily activities   Time 8   Period Weeks   Status On-going               Plan - 03/17/17 1612    Clinical Impression Statement Pt continually working towards increased strength and weaning from splint with no triggering of thumb   Rehab Potential Good   OT Frequency 2x / week   OT Duration 8 weeks   OT Treatment/Interventions  Self-care/ADL training;Fluidtherapy;DME and/or AE instruction;Splinting;Patient/family education;Therapeutic exercises;Ultrasound;Therapeutic activities;Cryotherapy;Iontophoresis;Passive range of motion;Parrafin;Manual Therapy   Plan gradually begin increasing grip and pinch strength, issue red putty   Consulted and Agree with Plan of Care Patient      Patient will benefit from skilled therapeutic intervention in order to improve the following deficits and impairments:  Increased edema, Pain, Impaired UE functional use, Decreased strength, Decreased range of motion  Visit Diagnosis: Stiffness of joint, hand, right  Muscle weakness (generalized)    Problem List Patient Active Problem List   Diagnosis Date Noted  . Pain of right thumb 07/07/2016  . Vitamin D deficiency 06/23/2016  . Osteoporosis 06/10/2016  . Hyperlipidemia 01/13/2015  . 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 Preventative health care 07/15/2014  . Hearing problem 07/15/2014  . Squamous cell carcinoma in situ 09/15/2012  . Hypothyroid 07/19/2012    Carey Bullocks, OTR/L 03/17/2017, 4:13 PM  [facility] [address] [place], [place], [address] Phone: [phone]   Fax:  [phone]  Name: Alexandra Hoover MRN: [id number] Date of Birth: 05/12/1956

## 2017-03-22 ENCOUNTER — Encounter: Payer: TRICARE For Life (TFL) | Admitting: Occupational Therapy

## 2017-03-23 ENCOUNTER — Ambulatory Visit
Admission: RE | Admit: 2017-03-23 | Discharge: 2017-03-23 | Disposition: A | Discharge status: Home / Self Care | Payer: TRICARE For Life (TFL) | Source: Ambulatory Visit | Attending: Family | Admitting: Family

## 2017-03-23 DIAGNOSIS — Z1231 Encounter for screening mammogram for malignant neoplasm of breast: Secondary | ICD-10-CM

## 2017-03-24 ENCOUNTER — Ambulatory Visit: Payer: TRICARE For Life (TFL) | Admitting: Occupational Therapy

## 2017-03-24 DIAGNOSIS — M6281 Muscle weakness (generalized): Secondary | ICD-10-CM

## 2017-03-24 DIAGNOSIS — M25641 Stiffness of right hand, not elsewhere classified: Secondary | ICD-10-CM

## 2017-03-24 DIAGNOSIS — M25541 Pain in joints of right hand: Secondary | ICD-10-CM

## 2017-03-24 NOTE — Therapy (Signed)
[facility] [address] [place], [place], [address] Phone: [phone]   Fax:  [phone]  Occupational Therapy Treatment  Patient Details  Name: Alexandra Hoover MRN: [id number] Date of Birth: [birth year] Referring Provider: Dr. Karlton Lemon  Encounter Date: 03/24/2017      OT End of Session - 03/24/17 1352    Visit Number 13   Number of Visits 17   Date for OT Re-Evaluation 04/02/17   Authorization Type [employer] - G Code required   Authorization - Visit Number 13   Authorization - Number of Visits 20   OT Start Time 6948   OT Stop Time 1350   OT Time Calculation (min) 35 min   Activity Tolerance Patient tolerated treatment well      Past Medical History:  Diagnosis Date  . History of colon polyps 2009  . Microcalcifications of the breast 2005   left breast  . 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 Thyroid disease    hypothyroid    Past Surgical History:  Procedure Laterality Date  . BREAST BIOPSY Left 2005   benign  . BREAST CYST ASPIRATION Right 2008  . BREAST SURGERY  2005   Left breast biopsy--microcalcifications  . FRACTURE SURGERY  12/2009   fib/tib fracture  . HARDWARE REMOVAL  10/2010 and 05/2017   screws removed from ankle    There were no vitals filed for this visit.      Subjective Assessment - 03/24/17 1319    Subjective  No triggering   Pertinent History osteoporosis, osteopenia   Patient Stated Goals to get rid of the pain and trigerring   Currently in Pain? No/denies            [facility] OT Assessment - 03/24/17 0001      Right Hand AROM   R Thumb MCP 0-60 73 Degrees   R Thumb IP 0-80 65 Degrees                  OT Treatments/Exercises (OP) - 03/24/17 0001      Hand Exercises   Other Hand Exercises Gripper set at level 2 to pick up blocks for sustained grip strength. Pt working on pinch  strength with clothespin activity yellow to blue resistance   Other Hand Exercises Upgraded to red putty and pt performed each ex from putty HEP x 10 reps. Issued red putty for home use.      Cryotherapy   Number Minutes Cryotherapy 8 Minutes   Cryotherapy Location Hand  at base of thumb   Type of Cryotherapy Ice massage                  OT Short Term Goals - 03/07/17 1307      OT SHORT TERM GOAL #1   Title Independent with initial HEP (Due 03/03/17)   Time 4   Period Weeks   Status Achieved     OT SHORT TERM GOAL #2   Title Independent with splint wear and care   Time 4   Period Weeks   Status Achieved     OT SHORT TERM GOAL #3   Title Pt to verbalize understanding with pain management strategies and task modifications   Time 4   Period Weeks   Status Achieved     OT SHORT TERM GOAL #4   Title Pt to report less triggering Rt thumb  t/o day with functional tasks   Time 4   Period Weeks   Status Achieved     OT SHORT TERM GOAL #5   Title Pt to verbalize understanding with potential A/E needs for writing and opening jars/bottles, etc   Time 4   Period Weeks   Status Achieved           OT Long Term Goals - 03/24/17 1352      OT LONG TERM GOAL #1   Title Independent with updated strengthening HEP (DUE 04/02/17)   Time 8   Period Weeks   Status Achieved     OT LONG TERM GOAL #2   Title Pt to increase lateral pinch strength by 3 lbs or greater, and tip pinch by 5 lbs or greater for functional tasks   Baseline eval: Lateral = 13, tip = 6   Time 8   Period Weeks   Status On-going     OT LONG TERM GOAL #3   Title Pt to improve thumb MP flexion and IP flexion by 8* or more for functional grasping   Baseline MP flex = 62*, IP flex = 42*   Time 8   Period Weeks   Status Achieved  03/24/17: MP flex = 73*, IP flex = 65*     OT LONG TERM GOAL #4   Title Pt to report pain less than or equal to 3/10 with daily activities   Time 8   Period Weeks   Status  Achieved               Plan - 03/24/17 1353    Clinical Impression Statement Pt met LTG's #3 and #4. Pt progressing with strengthening with no increase in triggering. Anticipate d/c next week   Rehab Potential Good   OT Frequency 2x / week   OT Duration 8 weeks   OT Treatment/Interventions Self-care/ADL training;Fluidtherapy;DME and/or AE instruction;Splinting;Patient/family education;Therapeutic exercises;Ultrasound;Therapeutic activities;Cryotherapy;Iontophoresis;Passive range of motion;Parrafin;Manual Therapy   Plan continue strengthening, d/c next week      Patient will benefit from skilled therapeutic intervention in order to improve the following deficits and impairments:  Increased edema, Pain, Impaired UE functional use, Decreased strength, Decreased range of motion  Visit Diagnosis: Stiffness of joint, hand, right  Muscle weakness (generalized)  Pain in thumb joint with movement of right hand    Problem List Patient Active Problem List   Diagnosis Date Noted  . Pain of right thumb 07/07/2016  . Vitamin D deficiency 06/23/2016  . Osteoporosis 06/10/2016  . Hyperlipidemia 01/13/2015  . 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 Preventative health care 07/15/2014  . Hearing problem 07/15/2014  . Squamous cell carcinoma in situ 09/15/2012  . Hypothyroid 07/19/2012    Carey Bullocks, OTR/L 03/24/2017, 1:54 PM  [facility] [address] [place], [place], [address] Phone: [phone]   Fax:  [phone]  Name: Alexandra Hoover MRN: [id number] Date of Birth: 06-11-1956

## 2017-03-28 ENCOUNTER — Ambulatory Visit: Attending: Family Medicine | Admitting: Occupational Therapy

## 2017-03-28 DIAGNOSIS — M25641 Stiffness of right hand, not elsewhere classified: Secondary | ICD-10-CM | POA: Insufficient documentation

## 2017-03-28 DIAGNOSIS — M6281 Muscle weakness (generalized): Secondary | ICD-10-CM | POA: Insufficient documentation

## 2017-03-28 NOTE — Therapy (Signed)
[facility] [address] [place], [place], [address] Phone: [phone]   Fax:  [phone]  Occupational Therapy Treatment  Patient Details  Name: Alexandra Hoover MRN: [id number] Date of Birth: 1956/03/02 Referring Provider: Dr. Karlton Lemon  Encounter Date: 03/28/2017      OT End of Session - 03/28/17 1335    Visit Number 14   Number of Visits 17   Date for OT Re-Evaluation 04/02/17   Authorization Type [employer] - G Code required   Authorization - Visit Number 14   Authorization - Number of Visits 20   OT Start Time 1150   OT Stop Time 1220   OT Time Calculation (min) 30 min   Activity Tolerance Patient tolerated treatment well      Past Medical History:  Diagnosis Date  . History of colon polyps 2009  . Microcalcifications of the breast 2005   left breast  . 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 Thyroid disease    hypothyroid    Past Surgical History:  Procedure Laterality Date  . BREAST BIOPSY Left 2005   benign  . BREAST CYST ASPIRATION Right 2008  . BREAST SURGERY  2005   Left breast biopsy--microcalcifications  . FRACTURE SURGERY  12/2009   fib/tib fracture  . HARDWARE REMOVAL  10/2010 and 05/2017   screws removed from ankle    There were no vitals filed for this visit.      Subjective Assessment - 03/28/17 1148    Subjective  No triggering   Pertinent History osteoporosis, osteopenia   Patient Stated Goals to get rid of the pain and trigerring   Currently in Pain? No/denies            [facility] OT Assessment - 03/28/17 0001      Hand Function   Right Hand Lateral Pinch 17 lbs   Right Hand 3 Point Pinch 11.5 lbs                  OT Treatments/Exercises (OP) - 03/28/17 0001      ADLs   ADL Comments Assessed remaining goal and reviewed progress to date with patient     Hand Exercises   Other Hand  Exercises Gripper set at level 2 to pick up blocks for sustained grip strength. Pt working on pinch strength with clothespin activity yellow to blue resistance                  OT Short Term Goals - 03/07/17 1307      OT SHORT TERM GOAL #1   Title Independent with initial HEP (Due 03/03/17)   Time 4   Period Weeks   Status Achieved     OT SHORT TERM GOAL #2   Title Independent with splint wear and care   Time 4   Period Weeks   Status Achieved     OT SHORT TERM GOAL #3   Title Pt to verbalize understanding with pain management strategies and task modifications   Time 4   Period Weeks   Status Achieved     OT SHORT TERM GOAL #4   Title Pt to report less triggering Rt thumb t/o day with functional tasks   Time 4   Period Weeks   Status Achieved     OT SHORT TERM GOAL #5   Title Pt to verbalize understanding with potential A/E needs  for writing and opening jars/bottles, etc   Time 4   Period Weeks   Status Achieved           OT Long Term Goals - 2017/04/10 1205      OT LONG TERM GOAL #1   Title Independent with updated strengthening HEP (DUE 04/02/17)   Time 8   Period Weeks   Status Achieved     OT LONG TERM GOAL #2   Title Pt to increase lateral pinch strength by 3 lbs or greater, and tip pinch by 5 lbs or greater for functional tasks   Baseline eval: Lateral = 13, tip = 6   Time 8   Period Weeks   Status Achieved  04/10/2017: Lat = 17, Tip = 11.5     OT LONG TERM GOAL #3   Title Pt to improve thumb MP flexion and IP flexion by 8* or more for functional grasping   Baseline MP flex = 62*, IP flex = 42*   Time 8   Period Weeks   Status Achieved  03/24/17: MP flex = 73*, IP flex = 65*     OT LONG TERM GOAL #4   Title Pt to report pain less than or equal to 3/10 with daily activities   Time 8   Period Weeks   Status Achieved               Plan - Apr 10, 2017 1212    Clinical Impression Statement Pt has met all STG's and LTG's.    Rehab  Potential Good   OT Frequency 2x / week   OT Duration 8 weeks   OT Treatment/Interventions Self-care/ADL training;Fluidtherapy;DME and/or AE instruction;Splinting;Patient/family education;Therapeutic exercises;Ultrasound;Therapeutic activities;Cryotherapy;Iontophoresis;Passive range of motion;Parrafin;Manual Therapy   Plan D/C O.Darene Lamer    Consulted and Agree with Plan of Care Patient      Patient will benefit from skilled therapeutic intervention in order to improve the following deficits and impairments:  Increased edema, Pain, Impaired UE functional use, Decreased strength, Decreased range of motion  Visit Diagnosis: Stiffness of joint, hand, right  Muscle weakness (generalized)      G-Codes - 04-10-17 1337    Functional Assessment Tool Used (Outpatient only) RUE: lateral pinch = 17 lbs, tip pinch = 11.5 lbs, pain mostly 0/10   Functional Limitation Carrying, moving and handling objects   Carrying, Moving and Handling Objects Goal Status (C1660) At least 1 percent but less than 20 percent impaired, limited or restricted   Carrying, Moving and Handling Objects Discharge Status [phone]) At least 1 percent but less than 20 percent impaired, limited or restricted      Problem List Patient Active Problem List   Diagnosis Date Noted  . Pain of right thumb 07/07/2016  . Vitamin D deficiency 06/23/2016  . Osteoporosis 06/10/2016  . Hyperlipidemia 01/13/2015  . 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 Preventative health care 07/15/2014  . Hearing problem 07/15/2014  . Squamous cell carcinoma in situ 09/15/2012  . Hypothyroid 07/19/2012    OCCUPATIONAL THERAPY DISCHARGE SUMMARY  Visits from Start of Care: 14  Current functional level related to goals / functional outcomes: SEE ABOVE   Remaining deficits: None   Education / Equipment: HEP's, splint wear and care, task modifications, potential A/E needs  Plan: Patient agrees to discharge.  Patient goals were met.  Patient is being discharged due to meeting the stated rehab goals.  ?????       Carey Bullocks, OTR/L April 10, 2017, 1:37 PM  [facility] [address] [place], [place], [address] Phone: [phone]   Fax:  [phone]  Name: Alexandra Hoover MRN: [id number] Date of Birth: October 18, 1956

## 2017-03-31 ENCOUNTER — Encounter: Payer: TRICARE For Life (TFL) | Admitting: Occupational Therapy

## 2017-04-13 ENCOUNTER — Encounter: Payer: Self-pay | Admitting: Family

## 2017-04-13 NOTE — Telephone Encounter (Signed)
Alexandra Hoover or Alexandra Hoover-- Can you look into this and let pt know if referral was required?
